# Patient Record
Sex: Male | Born: 1970
Health system: Southern US, Community
[De-identification: ages and names within clinical notes are randomized; demographics above are authoritative.]

## PROBLEM LIST (undated history)

## (undated) DIAGNOSIS — L309 Dermatitis, unspecified: Secondary | ICD-10-CM

## (undated) DIAGNOSIS — E119 Type 2 diabetes mellitus without complications: Secondary | ICD-10-CM

## (undated) DIAGNOSIS — I10 Essential (primary) hypertension: Secondary | ICD-10-CM

## (undated) HISTORY — DX: Dermatitis, unspecified: L30.9

## (undated) HISTORY — PX: GALLBLADDER SURGERY: SHX652

---

## 2017-08-20 DIAGNOSIS — J019 Acute sinusitis, unspecified: Secondary | ICD-10-CM | POA: Diagnosis not present

## 2017-09-15 DIAGNOSIS — H00022 Hordeolum internum right lower eyelid: Secondary | ICD-10-CM | POA: Diagnosis not present

## 2017-10-26 DIAGNOSIS — I10 Essential (primary) hypertension: Secondary | ICD-10-CM | POA: Diagnosis not present

## 2017-10-26 DIAGNOSIS — E1169 Type 2 diabetes mellitus with other specified complication: Secondary | ICD-10-CM | POA: Diagnosis not present

## 2017-10-26 DIAGNOSIS — E7800 Pure hypercholesterolemia, unspecified: Secondary | ICD-10-CM | POA: Diagnosis not present

## 2017-10-26 DIAGNOSIS — Z794 Long term (current) use of insulin: Secondary | ICD-10-CM | POA: Diagnosis not present

## 2018-02-28 DIAGNOSIS — I10 Essential (primary) hypertension: Secondary | ICD-10-CM | POA: Diagnosis not present

## 2018-02-28 DIAGNOSIS — E1169 Type 2 diabetes mellitus with other specified complication: Secondary | ICD-10-CM | POA: Diagnosis not present

## 2018-02-28 DIAGNOSIS — Z794 Long term (current) use of insulin: Secondary | ICD-10-CM | POA: Diagnosis not present

## 2018-02-28 DIAGNOSIS — Z23 Encounter for immunization: Secondary | ICD-10-CM | POA: Diagnosis not present

## 2018-02-28 DIAGNOSIS — E7800 Pure hypercholesterolemia, unspecified: Secondary | ICD-10-CM | POA: Diagnosis not present

## 2018-02-28 DIAGNOSIS — Z125 Encounter for screening for malignant neoplasm of prostate: Secondary | ICD-10-CM | POA: Diagnosis not present

## 2018-06-20 ENCOUNTER — Encounter (HOSPITAL_COMMUNITY): Payer: Self-pay

## 2018-06-20 ENCOUNTER — Other Ambulatory Visit: Payer: Self-pay

## 2018-06-20 ENCOUNTER — Ambulatory Visit (HOSPITAL_COMMUNITY)
Admission: EM | Admit: 2018-06-20 | Discharge: 2018-06-20 | Disposition: A | Discharge status: Home / Self Care | Payer: Federal, State, Local not specified - PPO | Attending: Physician Assistant | Admitting: Physician Assistant

## 2018-06-20 DIAGNOSIS — J0140 Acute pansinusitis, unspecified: Secondary | ICD-10-CM

## 2018-06-20 HISTORY — DX: Essential (primary) hypertension: I10

## 2018-06-20 HISTORY — DX: Type 2 diabetes mellitus without complications: E11.9

## 2018-06-20 MED ORDER — DOXYCYCLINE HYCLATE 100 MG PO CAPS: 100.0000 mg | ORAL_CAPSULE | Freq: Two times a day (BID) | ORAL | 0 refills | Status: DC

## 2018-06-20 MED ORDER — IPRATROPIUM BROMIDE 0.06 % NA SOLN: 2.0000 | Freq: Four times a day (QID) | NASAL | 0 refills | Status: DC

## 2018-06-20 NOTE — Discharge Instructions (Signed)
Start doxycycline to cover for bacterial sinus infection. Continue zyrtec. Start atrovent nasal spray for nasal congestion/drainage. You can use over the counter nasal saline rinse such as neti pot for nasal congestion. Keep hydrated, your urine should be clear to pale yellow in color. Tylenol/motrin for fever and pain. Monitor for any worsening of symptoms, chest pain, shortness of breath, wheezing, swelling of the throat, follow up for reevaluation.

## 2018-06-20 NOTE — ED Provider Notes (Signed)
[facility]    CSN: [id number] Arrival date & time: 06/20/18  4801     History   Chief Complaint Chief Complaint  Patient presents with  . Cough    HPI Kevin Richardson is a 48 y.o. male.   48 year old male comes in for 2-week history of URI symptoms.  Has had sore throat, rhinorrhea, nasal congestion, voice hoarseness.  States a couple of days ago, started having productive cough and chest congestion.  Denies chest pain, shortness of breath, wheezing.  Denies fever, chills, night sweats.  Has been using allergy medicine, cold medication without relief.  Never smoker.  Positive sick contact.  States DM improving, last A1c 7's     Past Medical History:  Diagnosis Date  . Diabetes mellitus without complication (HCC)   . Hypertension     There are no active problems to display for this patient.   Past Surgical History:  Procedure Laterality Date  . GALLBLADDER SURGERY         Home Medications    Prior to Admission medications   Medication Sig Start Date End Date Taking? Authorizing Provider  doxycycline (VIBRAMYCIN) 100 MG capsule Take 1 capsule (100 mg total) by mouth 2 (two) times daily. 06/20/18   Cathie Hoops, Dejai Schubach V, PA-C  ipratropium (ATROVENT) 0.06 % nasal spray Place 2 sprays into both nostrils 4 (four) times daily. 06/20/18   Belinda Fisher, PA-C    Family History History reviewed. No pertinent family history.  Social History Social History   Tobacco Use  . Smoking status: Never Smoker  . Smokeless tobacco: Never Used  Substance Use Topics  . Alcohol use: Never    Frequency: Never  . Drug use: Never     Allergies   Penicillins   Review of Systems Review of Systems  Reason unable to perform ROS: See HPI as above.     Physical Exam Triage Vital Signs ED Triage Vitals  Enc Vitals Group     BP 06/20/18 0911 (!) 135/95     Pulse Rate 06/20/18 0911 81     Resp 06/20/18 0911 18     Temp 06/20/18 0911 97.9 F (36.6 C)     Temp Source 06/20/18 0911  Oral     SpO2 06/20/18 0911 98 %     Weight 06/20/18 0922 265 lb (120.2 kg)     Height --      Head Circumference --      Peak Flow --      Pain Score 06/20/18 0922 0     Pain Loc --      Pain Edu? --      Excl. in GC? --    No data found.  Updated Vital Signs BP (!) 135/95 (BP Location: Left Arm)   Pulse 81   Temp 97.9 F (36.6 C) (Oral)   Resp 18   Wt 265 lb (120.2 kg)   SpO2 98%   Physical Exam Constitutional:      General: He is not in acute distress.    Appearance: He is well-developed. He is not ill-appearing, toxic-appearing or diaphoretic.  HENT:     Head: Normocephalic and atraumatic.     Right Ear: Tympanic membrane, ear canal and external ear normal. Tympanic membrane is not erythematous or bulging.     Left Ear: Tympanic membrane, ear canal and external ear normal. Tympanic membrane is not erythematous or bulging.     Nose:     Right Sinus: Maxillary sinus tenderness  and frontal sinus tenderness present.     Left Sinus: Maxillary sinus tenderness and frontal sinus tenderness present.     Mouth/Throat:     Mouth: Mucous membranes are moist.     Pharynx: Oropharynx is clear. Uvula midline.  Eyes:     Conjunctiva/sclera: Conjunctivae normal.     Pupils: Pupils are equal, round, and reactive to light.  Neck:     Musculoskeletal: Normal range of motion and neck supple.  Cardiovascular:     Rate and Rhythm: Normal rate and regular rhythm.     Heart sounds: Normal heart sounds. No murmur. No friction rub. No gallop.   Pulmonary:     Effort: Pulmonary effort is normal. No accessory muscle usage, prolonged expiration, respiratory distress or retractions.     Breath sounds: Normal breath sounds. No stridor, decreased air movement or transmitted upper airway sounds. No decreased breath sounds, wheezing, rhonchi or rales.  Skin:    General: Skin is warm and dry.  Neurological:     Mental Status: He is alert and oriented to person, place, and time.      UC  Treatments / Results  Labs (all labs ordered are listed, but only abnormal results are displayed) Labs Reviewed - No data to display  EKG None  Radiology No results found.  Procedures Procedures (including critical care time)  Medications Ordered in UC Medications - No data to display  Initial Impression / Assessment and Plan / UC Course  I have reviewed the triage vital signs and the nursing notes.  Pertinent labs & imaging results that were available during my care of the patient were reviewed by me and considered in my medical decision making (see chart for details).    Start doxycycline to cover for bacterial sinusitis. Other symptomatic treatment discussed, continue antihistamine. Push fluids. Return precautions given.  Final Clinical Impressions(s) / UC Diagnoses   Final diagnoses:  Acute non-recurrent pansinusitis    ED Prescriptions    Medication Sig Dispense Auth. Provider   doxycycline (VIBRAMYCIN) 100 MG capsule Take 1 capsule (100 mg total) by mouth 2 (two) times daily. 20 capsule Margaretann Abate V, PA-C   ipratropium (ATROVENT) 0.06 % nasal spray Place 2 sprays into both nostrils 4 (four) times daily. 15 mL Threasa Alpha, [place] 06/20/18 [id number]

## 2018-06-20 NOTE — ED Triage Notes (Signed)
Pt cc  chest congestion, sinus pressure and drainage. X 1 week or more.pt has used the OTC meds and nothing worked.

## 2018-09-09 DIAGNOSIS — E1169 Type 2 diabetes mellitus with other specified complication: Secondary | ICD-10-CM | POA: Diagnosis not present

## 2018-09-09 DIAGNOSIS — I10 Essential (primary) hypertension: Secondary | ICD-10-CM | POA: Diagnosis not present

## 2018-09-09 DIAGNOSIS — Z794 Long term (current) use of insulin: Secondary | ICD-10-CM | POA: Diagnosis not present

## 2018-09-09 DIAGNOSIS — E7800 Pure hypercholesterolemia, unspecified: Secondary | ICD-10-CM | POA: Diagnosis not present

## 2018-09-10 ENCOUNTER — Encounter (HOSPITAL_COMMUNITY): Payer: Self-pay

## 2018-09-10 ENCOUNTER — Emergency Department (HOSPITAL_COMMUNITY): Payer: Federal, State, Local not specified - PPO

## 2018-09-10 ENCOUNTER — Other Ambulatory Visit: Payer: Self-pay

## 2018-09-10 ENCOUNTER — Emergency Department (HOSPITAL_COMMUNITY)
Admission: EM | Admit: 2018-09-10 | Discharge: 2018-09-10 | Disposition: A | Discharge status: Home / Self Care | Payer: Federal, State, Local not specified - PPO | Attending: Emergency Medicine | Admitting: Emergency Medicine

## 2018-09-10 DIAGNOSIS — I10 Essential (primary) hypertension: Secondary | ICD-10-CM | POA: Diagnosis not present

## 2018-09-10 DIAGNOSIS — S61213A Laceration without foreign body of left middle finger without damage to nail, initial encounter: Secondary | ICD-10-CM | POA: Diagnosis not present

## 2018-09-10 DIAGNOSIS — E119 Type 2 diabetes mellitus without complications: Secondary | ICD-10-CM | POA: Insufficient documentation

## 2018-09-10 DIAGNOSIS — S92592A Other fracture of left lesser toe(s), initial encounter for closed fracture: Secondary | ICD-10-CM | POA: Insufficient documentation

## 2018-09-10 DIAGNOSIS — S66121A Laceration of flexor muscle, fascia and tendon of left index finger at wrist and hand level, initial encounter: Secondary | ICD-10-CM | POA: Diagnosis not present

## 2018-09-10 DIAGNOSIS — S61315A Laceration without foreign body of left ring finger with damage to nail, initial encounter: Secondary | ICD-10-CM

## 2018-09-10 DIAGNOSIS — S56129A Laceration of flexor muscle, fascia and tendon of unspecified finger at forearm level, initial encounter: Secondary | ICD-10-CM

## 2018-09-10 DIAGNOSIS — S61412A Laceration without foreign body of left hand, initial encounter: Secondary | ICD-10-CM | POA: Diagnosis not present

## 2018-09-10 DIAGNOSIS — S62631A Displaced fracture of distal phalanx of left index finger, initial encounter for closed fracture: Secondary | ICD-10-CM | POA: Diagnosis not present

## 2018-09-10 DIAGNOSIS — Y939 Activity, unspecified: Secondary | ICD-10-CM | POA: Insufficient documentation

## 2018-09-10 DIAGNOSIS — S62661B Nondisplaced fracture of distal phalanx of left index finger, initial encounter for open fracture: Secondary | ICD-10-CM | POA: Diagnosis not present

## 2018-09-10 DIAGNOSIS — Z1159 Encounter for screening for other viral diseases: Secondary | ICD-10-CM | POA: Diagnosis not present

## 2018-09-10 DIAGNOSIS — W293XXA Contact with powered garden and outdoor hand tools and machinery, initial encounter: Secondary | ICD-10-CM | POA: Diagnosis not present

## 2018-09-10 DIAGNOSIS — Z23 Encounter for immunization: Secondary | ICD-10-CM | POA: Diagnosis not present

## 2018-09-10 DIAGNOSIS — S62633B Displaced fracture of distal phalanx of left middle finger, initial encounter for open fracture: Secondary | ICD-10-CM | POA: Diagnosis not present

## 2018-09-10 DIAGNOSIS — S62633A Displaced fracture of distal phalanx of left middle finger, initial encounter for closed fracture: Secondary | ICD-10-CM | POA: Diagnosis not present

## 2018-09-10 DIAGNOSIS — S61209A Unspecified open wound of unspecified finger without damage to nail, initial encounter: Secondary | ICD-10-CM

## 2018-09-10 DIAGNOSIS — S62661A Nondisplaced fracture of distal phalanx of left index finger, initial encounter for closed fracture: Secondary | ICD-10-CM | POA: Diagnosis not present

## 2018-09-10 DIAGNOSIS — S61313A Laceration without foreign body of left middle finger with damage to nail, initial encounter: Secondary | ICD-10-CM

## 2018-09-10 DIAGNOSIS — S62631B Displaced fracture of distal phalanx of left index finger, initial encounter for open fracture: Secondary | ICD-10-CM | POA: Diagnosis not present

## 2018-09-10 DIAGNOSIS — S62639B Displaced fracture of distal phalanx of unspecified finger, initial encounter for open fracture: Secondary | ICD-10-CM

## 2018-09-10 DIAGNOSIS — Y929 Unspecified place or not applicable: Secondary | ICD-10-CM | POA: Diagnosis not present

## 2018-09-10 DIAGNOSIS — Z03818 Encounter for observation for suspected exposure to other biological agents ruled out: Secondary | ICD-10-CM | POA: Diagnosis not present

## 2018-09-10 LAB — CBC WITH DIFFERENTIAL/PLATELET
Abs Immature Granulocytes: 0.01 10*3/uL (ref 0.00–0.07)
Basophils Absolute: 0 10*3/uL (ref 0.0–0.1)
Basophils Relative: 0 %
Eosinophils Absolute: 0 10*3/uL (ref 0.0–0.5)
Eosinophils Relative: 1 %
HCT: 42.3 % (ref 39.0–52.0)
Hemoglobin: 13.9 g/dL (ref 13.0–17.0)
Immature Granulocytes: 0 %
Lymphocytes Relative: 25 %
Lymphs Abs: 1.3 10*3/uL (ref 0.7–4.0)
MCH: 27.4 pg (ref 26.0–34.0)
MCHC: 32.9 g/dL (ref 30.0–36.0)
MCV: 83.3 fL (ref 80.0–100.0)
Monocytes Absolute: 0.3 10*3/uL (ref 0.1–1.0)
Monocytes Relative: 5 %
Neutro Abs: 3.6 10*3/uL (ref 1.7–7.7)
Neutrophils Relative %: 69 %
Platelets: 288 10*3/uL (ref 150–400)
RBC: 5.08 MIL/uL (ref 4.22–5.81)
RDW: 12.7 % (ref 11.5–15.5)
WBC: 5.2 10*3/uL (ref 4.0–10.5)
nRBC: 0 % (ref 0.0–0.2)

## 2018-09-10 LAB — BASIC METABOLIC PANEL
Anion gap: 8 (ref 5–15)
BUN: 18 mg/dL (ref 6–20)
CO2: 27 mmol/L (ref 22–32)
Calcium: 9.3 mg/dL (ref 8.9–10.3)
Chloride: 99 mmol/L (ref 98–111)
Creatinine, Ser: 1.04 mg/dL (ref 0.61–1.24)
GFR calc Af Amer: >60 mL/min (ref >60)
GFR calc non Af Amer: >60 mL/min (ref >60)
Glucose, Bld: 127 mg/dL — ABNORMAL HIGH (ref 70–99)
Potassium: 3.3 mmol/L — ABNORMAL LOW (ref 3.5–5.1)
Sodium: 134 mmol/L — ABNORMAL LOW (ref 135–145)

## 2018-09-10 LAB — SARS CORONAVIRUS 2 BY RT PCR (HOSPITAL ORDER, PERFORMED IN ~~LOC~~ HOSPITAL LAB): SARS Coronavirus 2: NEGATIVE

## 2018-09-10 LAB — CBG MONITORING, ED: Glucose-Capillary: 118 mg/dL — ABNORMAL HIGH (ref 70–99)

## 2018-09-10 MED ORDER — TETANUS-DIPHTH-ACELL PERTUSSIS 5-2.5-18.5 LF-MCG/0.5 IM SUSP
0.5000 mL | Freq: Once | INTRAMUSCULAR | Status: AC
  Administered 2018-09-10: 0.5 mL via INTRAMUSCULAR
  Filled 2018-09-10: qty 0.5

## 2018-09-10 MED ORDER — SULFAMETHOXAZOLE-TRIMETHOPRIM 800-160 MG PO TABS: 1.0000 | ORAL_TABLET | Freq: Two times a day (BID) | ORAL | 0 refills | Status: AC

## 2018-09-10 MED ORDER — HYDROCODONE-ACETAMINOPHEN 5-325 MG PO TABS: 2.0000 | ORAL_TABLET | ORAL | 0 refills | Status: AC | PRN

## 2018-09-10 MED ORDER — LIDOCAINE HCL (PF) 1 % IJ SOLN
30.0000 mL | Freq: Once | INTRAMUSCULAR | Status: AC
  Administered 2018-09-10: 30 mL
  Filled 2018-09-10: qty 30

## 2018-09-10 NOTE — ED Triage Notes (Signed)
Onset 4:15p pt cut left middle and index finger with hedge trimmer.  Oozing blood.  Moving fingers, sensation intact.

## 2018-09-10 NOTE — ED Notes (Signed)
Pt feeling lightheaded, wanting BS checked.

## 2018-09-10 NOTE — Discharge Instructions (Signed)
You are scheduled for surgery with Dr. Amanda Pea tomorrow.  Please do not eat or drink anything after midnight.  The medication prescribed for pain may cause dizziness, drowsiness or addiction.  Please only take as needed for severe pain.  Please take your antibiotics exactly as prescribed and until completed. Thank you for allowing me to care for you today. Please return to the emergency department if you have new or worsening symptoms. Take your medications as instructed.

## 2018-09-10 NOTE — Consult Note (Signed)
Reason for Consult: Hedge trimmer injury left index and middle finger Referring Physician: ER staff  Dell Pontodward Smaldone is an 48 y.o. male.  HPI: Patient presents with left index and middle finger hedge trimmer injury.  Patient is a diabetic.  He takes a insulin.  Patient notes no other complaints today.  I reviewed all issues with him at length.  Following our discussion we performed a evaluation of his x-rays.  There is bony encroachment on his x-ray.  At this juncture I do feel he needs a irrigation debridement and repair of structures with evaluation as necessary.  Although the patient states he is allergic to penicillin he endorses that he can take amoxicillin and Augmentin without difficulty.  Past Medical History:  Diagnosis Date  . Diabetes mellitus without complication (HCC)   . Hypertension     Past Surgical History:  Procedure Laterality Date  . GALLBLADDER SURGERY      History reviewed. No pertinent family history.  Social History:  reports that he has never smoked. He has never used smokeless tobacco. He reports that he does not drink alcohol or use drugs.  Allergies:  Allergies  Allergen Reactions  . Penicillins Anaphylaxis    Medications: I have reviewed the patient's current medications.  Results for orders placed or performed during the hospital encounter of 09/10/18 (from the past 48 hour(s))  POC CBG, ED     Status: Abnormal   Collection Time: 09/10/18  5:24 PM  Result Value Ref Range   Glucose-Capillary 118 (H) 70 - 99 mg/dL    Dg Hand Complete Left  Result Date: 09/10/2018 CLINICAL DATA:  Trauma, injury, lacerations left second third digits EXAM: LEFT HAND - COMPLETE 3+ VIEW COMPARISON:  None. FINDINGS: Left second digit distal phalanx tuft acute nondisplaced fracture noted. Left third finger distal phalanx small avulsion fracture at the DIP joint noted. Soft tissue injuries overlying these fractures. No subluxation dislocation. No other joint  abnormality. No radiopaque foreign body. IMPRESSION: Acute fractures of the left second and third distal phalanges as above. Electronically Signed   By: Judie PetitM.  Shick M.D.   On: 09/10/2018 17:46    Review of Systems  Respiratory: Negative.   Cardiovascular: Negative.   Gastrointestinal: Negative.   Genitourinary: Negative.    Blood pressure 117/86, pulse 88, temperature 99.2 F (37.3 C), temperature source Oral, resp. rate 16, SpO2 100 %. Physical Exam Open injury to the index and middle finger secondary to hedge trimmer injury left hand.  He has bony injury notable.  The second and third distal phalanges have significant bony injury to them.  The patient is alert and oriented in no acute distress. The patient complains of pain in the affected upper extremity.  The patient is noted to have a normal HEENT exam. Lung fields show equal chest expansion and no shortness of breath. Abdomen exam is nontender without distention. Lower extremity examination does not show any fracture dislocation or blood clot symptoms. Pelvis is stable and the neck and back are stable and nontender. Assessment/Plan: Patient has significant injury to the index and middle finger secondary to hedge trimming injury process.  We will plan for irrigation debridement and exploration.  Procedure: Patient was seen and underwent a digital block by myself with lidocaine without epinephrine he was prepped and draped with Betadine scrub and paint x2 separate scrubs followed by irrigation.  Once this was complete we attended to the index finger with nail plate removal followed by debridement of skin subtenons tissue and bone  and open treatment of his distal tuft injury/fracture followed by repair of the nailbed and the skin laceration.  Thus the index finger was addressed with #1 irrigation and debridement of an open bony injury/fracture #2 nail plate removal #3 complex nailbed repair #4 open treatment distal fracture about the  phalanx  Following this the middle finger was addressed.  I performed irrigation debridement of skin subtenons tissue tendon and bone.  He had an open fracture.  He has a significant FDP laceration and also has a radial digital nerve laceration.  He has multiple cuts.  Given the tendon injury open DIP joint open bony fracture and the nerve injury as well as ligamentous encroachment radially I irrigated debrided and performed a thorough exploration of the wound.  We elected to set him up for an elective repair after the severity was noted.  I dressed the wound according to a sterile protocol and discussed with the patient the findings.  Thus in regards to the middle finger the patient #1 underwent irrigation debridement of an open fracture and #2 exploration at great length noting the significant nerve tendon injury as well as collateral ligament and DIP encroachment.  Following this we discussed with the patient all issues plans and concerns.  We will get him booked for surgery tomorrow morning for tendon nerve and associated structure repair.  All questions have been addressed.  We are planning surgery for your upper extremity. The risk and benefits of surgery to include risk of bleeding, infection, anesthesia,  damage to normal structures and failure of the surgery to accomplish its intended goals of relieving symptoms and restoring function have been discussed in detail. With this in mind we plan to proceed. I have specifically discussed with the patient the pre-and postoperative regime and the dos and don'ts and risk and benefits in great detail. Risk and benefits of surgery also include risk of dystrophy(CRPS), chronic nerve pain, failure of the healing process to go onto completion and other inherent risks of surgery The relavent the pathophysiology of the disease/injury process, as well as the alternatives for treatment and postoperative course of action has been discussed in great detail with  the patient who desires to proceed.  We will do everything in our power to help you (the patient) restore function to the upper extremity. It is a pleasure to see this patient today.   Dionne Ano Jorja Empie III 09/10/2018, 7:01 PM

## 2018-09-10 NOTE — ED Provider Notes (Signed)
[facility] Note   CSN: [id number] Arrival date & time: 09/10/18  1708    History   Chief Complaint Chief Complaint  Patient presents with  . Laceration    HPI Dell Pontodward Viets is a 48 y.o. male.     Patient is a 48 year old gentleman with past medical history of diabetes, hypertension presenting to the emergency department for left hand laceration after attempting to use his hedge cutter today.  This happened just prior to arrival.  Unknown tetanus shot.     Past Medical History:  Diagnosis Date  . Diabetes mellitus without complication (HCC)   . Hypertension     There are no active problems to display for this patient.   Past Surgical History:  Procedure Laterality Date  . GALLBLADDER SURGERY          Home Medications    Prior to Admission medications   Medication Sig Start Date End Date Taking? Authorizing Provider  doxycycline (VIBRAMYCIN) 100 MG capsule Take 1 capsule (100 mg total) by mouth 2 (two) times daily. 06/20/18   Cathie HoopsYu, Amy V, PA-C  HYDROcodone-acetaminophen (NORCO/VICODIN) 5-325 MG tablet Take 2 tablets by mouth every 4 (four) hours as needed for up to 5 days for severe pain. 09/10/18 09/15/18  Ronnie DossMcLean, Flannery Cavallero A, PA-C  ipratropium (ATROVENT) 0.06 % nasal spray Place 2 sprays into both nostrils 4 (four) times daily. 06/20/18   Cathie HoopsYu, Amy V, PA-C  sulfamethoxazole-trimethoprim (BACTRIM DS) 800-160 MG tablet Take 1 tablet by mouth 2 (two) times daily for 14 days. 09/10/18 09/24/18  Arlyn DunningMcLean, Vannia Pola A, PA-C    Family History History reviewed. No pertinent family history.  Social History Social History   Tobacco Use  . Smoking status: Never Smoker  . Smokeless tobacco: Never Used  Substance Use Topics  . Alcohol use: Never    Frequency: Never  . Drug use: Never     Allergies   Penicillins   Review of Systems Review of Systems  Constitutional: Negative for fever.  Musculoskeletal: Positive for arthralgias.   Skin: Positive for wound.  Allergic/Immunologic: Negative for immunocompromised state.  Neurological: Negative for dizziness and headaches.  Hematological: Does not bruise/bleed easily.     Physical Exam Updated Vital Signs BP 117/86   Pulse 88   Temp 99.2 F (37.3 C) (Oral)   Resp 16   SpO2 100%   Physical Exam Vitals signs and nursing note reviewed.  Constitutional:      Appearance: Normal appearance.  HENT:     Head: Normocephalic.  Eyes:     Conjunctiva/sclera: Conjunctivae normal.  Pulmonary:     Effort: Pulmonary effort is normal.  Skin:    General: Skin is dry.     Comments: Left hand index finger and middle finger with distal multiple lacerations, bleeding.  There is damage to both nails.  Neurological:     Mental Status: He is alert.  Psychiatric:        Mood and Affect: Mood normal.      ED Treatments / Results  Labs (all labs ordered are listed, but only abnormal results are displayed) Labs Reviewed  CBG MONITORING, ED - Abnormal; Notable for the following components:      Result Value   Glucose-Capillary 118 (*)    All other components within normal limits  SARS CORONAVIRUS 2 (HOSPITAL ORDER, PERFORMED IN [place] [facility])  BASIC METABOLIC PANEL  CBC WITH DIFFERENTIAL/PLATELET    EKG None  Radiology Dg Hand Complete  Left  Result Date: 09/10/2018 CLINICAL DATA:  Trauma, injury, lacerations left second third digits EXAM: LEFT HAND - COMPLETE 3+ VIEW COMPARISON:  None. FINDINGS: Left second digit distal phalanx tuft acute nondisplaced fracture noted. Left third finger distal phalanx small avulsion fracture at the DIP joint noted. Soft tissue injuries overlying these fractures. No subluxation dislocation. No other joint abnormality. No radiopaque foreign body. IMPRESSION: Acute fractures of the left second and third distal phalanges as above. Electronically Signed   By: Judie Petit.  Shick M.D.   On: 09/10/2018 17:46    Procedures Procedures  (including critical care time)  Medications Ordered in ED Medications  Tdap (BOOSTRIX) injection 0.5 mL (0.5 mLs Intramuscular Given 09/10/18 1746)  lidocaine (PF) (XYLOCAINE) 1 % injection 30 mL (30 mLs Infiltration Given 09/10/18 1747)     Initial Impression / Assessment and Plan / ED Course  I have reviewed the triage vital signs and the nursing notes.  Pertinent labs & imaging results that were available during my care of the patient were reviewed by me and considered in my medical decision making (see chart for details).  Clinical Course as of Sep 09 1925  Sat Sep 10, 2018  1728 I discussed the case with Dr. Amanda Pea the hand Ortho specialist who will come and assess the patient.  At this time will obtain a hand x-ray, tetanus shot and will place materials at bedside for the surgeon.   [KM]  1906 I assisted Dr. Amanda Pea with the repair of this laceration.  Upon washing out the finger it was realized that there is a tendon laceration and nerve laceration.  Dr. Amanda Pea thought it best to bring the patient to the OR.  Will schedule for OR tomorrow morning.  Per Dr. Carlos Levering orders I ordered a COVID test, basic labs, EKG.  Per Dr. Carlos Levering orders I prescribed the patient 14 days of Bactrim and 30 tablets of hydrocodone.   [KM]    Clinical Course User Index [KM] Arlyn Dunning, PA-C         Final Clinical Impressions(s) / ED Diagnoses   Final diagnoses:  Open displaced fracture of distal phalanx of finger, unspecified finger, initial encounter  Laceration of left middle finger without foreign body with damage to nail, initial encounter  Laceration of left ring finger without foreign body with damage to nail, initial encounter  Flexor tendon laceration of finger with open wound, initial encounter    ED Discharge Orders         Ordered    sulfamethoxazole-trimethoprim (BACTRIM DS) 800-160 MG tablet  2 times daily     09/10/18 1852    HYDROcodone-acetaminophen (NORCO/VICODIN) 5-325  MG tablet  Every 4 hours PRN     09/10/18 1852           Jeral Pinch 09/10/18 Dara Lords, MD 09/13/18 1512

## 2018-09-11 ENCOUNTER — Ambulatory Visit (HOSPITAL_COMMUNITY)
Admission: RE | Admit: 2018-09-11 | Discharge: 2018-09-11 | Disposition: A | Discharge status: Home / Self Care | Payer: Federal, State, Local not specified - PPO | Attending: Orthopedic Surgery | Admitting: Orthopedic Surgery

## 2018-09-11 ENCOUNTER — Ambulatory Visit (HOSPITAL_COMMUNITY): Payer: Federal, State, Local not specified - PPO | Admitting: Registered Nurse

## 2018-09-11 ENCOUNTER — Encounter (HOSPITAL_COMMUNITY)
Admission: RE | Disposition: A | Discharge status: Home / Self Care | Payer: Self-pay | Source: Home / Self Care | Attending: Orthopedic Surgery

## 2018-09-11 DIAGNOSIS — Z794 Long term (current) use of insulin: Secondary | ICD-10-CM | POA: Insufficient documentation

## 2018-09-11 DIAGNOSIS — Z79899 Other long term (current) drug therapy: Secondary | ICD-10-CM | POA: Insufficient documentation

## 2018-09-11 DIAGNOSIS — S62603B Fracture of unspecified phalanx of left middle finger, initial encounter for open fracture: Secondary | ICD-10-CM | POA: Diagnosis not present

## 2018-09-11 DIAGNOSIS — S62633B Displaced fracture of distal phalanx of left middle finger, initial encounter for open fracture: Secondary | ICD-10-CM | POA: Diagnosis not present

## 2018-09-11 DIAGNOSIS — W293XXA Contact with powered garden and outdoor hand tools and machinery, initial encounter: Secondary | ICD-10-CM | POA: Diagnosis not present

## 2018-09-11 DIAGNOSIS — S64493A Injury of digital nerve of left middle finger, initial encounter: Secondary | ICD-10-CM | POA: Diagnosis not present

## 2018-09-11 DIAGNOSIS — E119 Type 2 diabetes mellitus without complications: Secondary | ICD-10-CM | POA: Diagnosis not present

## 2018-09-11 DIAGNOSIS — S63633A Sprain of interphalangeal joint of left middle finger, initial encounter: Secondary | ICD-10-CM | POA: Insufficient documentation

## 2018-09-11 DIAGNOSIS — I10 Essential (primary) hypertension: Secondary | ICD-10-CM | POA: Insufficient documentation

## 2018-09-11 DIAGNOSIS — S66123A Laceration of flexor muscle, fascia and tendon of left middle finger at wrist and hand level, initial encounter: Secondary | ICD-10-CM | POA: Insufficient documentation

## 2018-09-11 DIAGNOSIS — S62601B Fracture of unspecified phalanx of left index finger, initial encounter for open fracture: Secondary | ICD-10-CM | POA: Diagnosis not present

## 2018-09-11 HISTORY — PX: ARTERY AND TENDON REPAIR: SHX5696

## 2018-09-11 HISTORY — PX: I & D EXTREMITY: SHX5045

## 2018-09-11 LAB — GLUCOSE, CAPILLARY
Glucose-Capillary: 147 mg/dL — ABNORMAL HIGH (ref 70–99)
Glucose-Capillary: 121 mg/dL — ABNORMAL HIGH (ref 70–99)

## 2018-09-11 SURGERY — IRRIGATION AND DEBRIDEMENT EXTREMITY
Anesthesia: General | Site: Finger | Laterality: Left

## 2018-09-11 MED ORDER — KETOROLAC TROMETHAMINE 30 MG/ML IJ SOLN: 30.0000 mg | Freq: Once | INTRAMUSCULAR | Status: DC

## 2018-09-11 MED ORDER — ONDANSETRON HCL 4 MG/2ML IJ SOLN
INTRAMUSCULAR | Status: DC | PRN
  Administered 2018-09-11: 4 mg via INTRAVENOUS

## 2018-09-11 MED ORDER — FENTANYL CITRATE (PF) 250 MCG/5ML IJ SOLN
INTRAMUSCULAR | Status: AC
  Filled 2018-09-11: qty 5

## 2018-09-11 MED ORDER — FENTANYL CITRATE (PF) 100 MCG/2ML IJ SOLN
INTRAMUSCULAR | Status: DC | PRN
  Administered 2018-09-11 (×2): 50 ug via INTRAVENOUS

## 2018-09-11 MED ORDER — PROPOFOL 10 MG/ML IV BOLUS
INTRAVENOUS | Status: AC
  Filled 2018-09-11: qty 20

## 2018-09-11 MED ORDER — MIDAZOLAM HCL 2 MG/2ML IJ SOLN
INTRAMUSCULAR | Status: AC
  Filled 2018-09-11: qty 2

## 2018-09-11 MED ORDER — BUPIVACAINE HCL (PF) 0.25 % IJ SOLN
INTRAMUSCULAR | Status: DC | PRN
  Administered 2018-09-11: 10 mL

## 2018-09-11 MED ORDER — ACETAMINOPHEN 500 MG PO TABS
ORAL_TABLET | ORAL | Status: AC
  Filled 2018-09-11: qty 2

## 2018-09-11 MED ORDER — LIDOCAINE 2% (20 MG/ML) 5 ML SYRINGE
INTRAMUSCULAR | Status: DC | PRN
  Administered 2018-09-11: 60 mg via INTRAVENOUS

## 2018-09-11 MED ORDER — PROPOFOL 10 MG/ML IV BOLUS
INTRAVENOUS | Status: DC | PRN
  Administered 2018-09-11: 200 mg via INTRAVENOUS

## 2018-09-11 MED ORDER — ONDANSETRON HCL 4 MG/2ML IJ SOLN
INTRAMUSCULAR | Status: AC
  Filled 2018-09-11: qty 2

## 2018-09-11 MED ORDER — CEFAZOLIN SODIUM-DEXTROSE 2-4 GM/100ML-% IV SOLN
INTRAVENOUS | Status: AC
  Filled 2018-09-11: qty 100

## 2018-09-11 MED ORDER — LIDOCAINE 2% (20 MG/ML) 5 ML SYRINGE
INTRAMUSCULAR | Status: AC
  Filled 2018-09-11: qty 5

## 2018-09-11 MED ORDER — PROMETHAZINE HCL 25 MG/ML IJ SOLN: 6.2500 mg | INTRAMUSCULAR | Status: DC | PRN

## 2018-09-11 MED ORDER — CEFAZOLIN SODIUM-DEXTROSE 2-3 GM-%(50ML) IV SOLR
INTRAVENOUS | Status: DC | PRN
  Administered 2018-09-11: 2 g via INTRAVENOUS

## 2018-09-11 MED ORDER — DEXAMETHASONE SODIUM PHOSPHATE 10 MG/ML IJ SOLN
INTRAMUSCULAR | Status: AC
  Filled 2018-09-11: qty 1

## 2018-09-11 MED ORDER — MIDAZOLAM HCL 2 MG/2ML IJ SOLN
INTRAMUSCULAR | Status: DC | PRN
  Administered 2018-09-11: 2 mg via INTRAVENOUS

## 2018-09-11 MED ORDER — ACETAMINOPHEN 10 MG/ML IV SOLN: 1000.0000 mg | Freq: Once | INTRAVENOUS | Status: DC | PRN

## 2018-09-11 MED ORDER — LACTATED RINGERS IV SOLN
INTRAVENOUS | Status: DC | PRN
  Administered 2018-09-11: 07:00:00 via INTRAVENOUS

## 2018-09-11 MED ORDER — OXYCODONE HCL 5 MG/5ML PO SOLN: 5.0000 mg | Freq: Once | ORAL | Status: DC | PRN

## 2018-09-11 MED ORDER — SODIUM CHLORIDE 0.9 % IR SOLN
Status: DC | PRN
  Administered 2018-09-11: 3000 mL
  Administered 2018-09-11: 1

## 2018-09-11 MED ORDER — HYDROMORPHONE HCL 1 MG/ML IJ SOLN: 0.2500 mg | INTRAMUSCULAR | Status: DC | PRN

## 2018-09-11 MED ORDER — ACETAMINOPHEN 500 MG PO TABS
1000.0000 mg | ORAL_TABLET | Freq: Once | ORAL | Status: AC
  Administered 2018-09-11: 1000 mg via ORAL

## 2018-09-11 MED ORDER — OXYCODONE HCL 5 MG PO TABS: 5.0000 mg | ORAL_TABLET | Freq: Once | ORAL | Status: DC | PRN

## 2018-09-11 MED ORDER — DEXAMETHASONE SODIUM PHOSPHATE 10 MG/ML IJ SOLN
INTRAMUSCULAR | Status: DC | PRN
  Administered 2018-09-11: 5 mg via INTRAVENOUS

## 2018-09-11 SURGICAL SUPPLY — 60 items
BANDAGE ACE 3X5.8 VEL STRL LF (GAUZE/BANDAGES/DRESSINGS) ×2 IMPLANT
BANDAGE ACE 4X5 VEL STRL LF (GAUZE/BANDAGES/DRESSINGS) ×6 IMPLANT
BNDG COHESIVE 1X5 TAN STRL LF (GAUZE/BANDAGES/DRESSINGS) IMPLANT
BNDG CONFORM 2 STRL LF (GAUZE/BANDAGES/DRESSINGS) IMPLANT
BNDG GAUZE ELAST 4 BULKY (GAUZE/BANDAGES/DRESSINGS) ×4 IMPLANT
CORDS BIPOLAR (ELECTRODE) ×2 IMPLANT
COVER SURGICAL LIGHT HANDLE (MISCELLANEOUS) ×2 IMPLANT
COVER WAND RF STERILE (DRAPES) ×2 IMPLANT
CUFF TOURNIQUET SINGLE 18IN (TOURNIQUET CUFF) ×2 IMPLANT
CUFF TOURNIQUET SINGLE 24IN (TOURNIQUET CUFF) IMPLANT
DECANTER SPIKE VIAL GLASS SM (MISCELLANEOUS) ×2 IMPLANT
DRAPE SURG 17X23 STRL (DRAPES) ×2 IMPLANT
DRSG ADAPTIC 3X8 NADH LF (GAUZE/BANDAGES/DRESSINGS) ×2 IMPLANT
DRSG MEPITEL 4X7.2 (GAUZE/BANDAGES/DRESSINGS) ×2 IMPLANT
DRSG XEROFORM 1X8 (GAUZE/BANDAGES/DRESSINGS) ×2 IMPLANT
GAUZE SPONGE 2X2 8PLY STRL LF (GAUZE/BANDAGES/DRESSINGS) IMPLANT
GAUZE SPONGE 4X4 12PLY STRL (GAUZE/BANDAGES/DRESSINGS) ×2 IMPLANT
GAUZE XEROFORM 1X8 LF (GAUZE/BANDAGES/DRESSINGS) ×2 IMPLANT
GLOVE BIOGEL M 8.0 STRL (GLOVE) ×2 IMPLANT
GLOVE SS BIOGEL STRL SZ 8 (GLOVE) ×1 IMPLANT
GLOVE SUPERSENSE BIOGEL SZ 8 (GLOVE) ×1
GOWN STRL REUS W/ TWL LRG LVL3 (GOWN DISPOSABLE) ×2 IMPLANT
GOWN STRL REUS W/ TWL XL LVL3 (GOWN DISPOSABLE) ×3 IMPLANT
GOWN STRL REUS W/TWL LRG LVL3 (GOWN DISPOSABLE) ×2
GOWN STRL REUS W/TWL XL LVL3 (GOWN DISPOSABLE) ×3
KIT BASIN OR (CUSTOM PROCEDURE TRAY) ×2 IMPLANT
KIT TURNOVER KIT B (KITS) ×2 IMPLANT
LOOP VESSEL MAXI BLUE (MISCELLANEOUS) IMPLANT
MANIFOLD NEPTUNE II (INSTRUMENTS) ×2 IMPLANT
NEEDLE HYPO 25GX1X1/2 BEV (NEEDLE) ×2 IMPLANT
NS IRRIG 1000ML POUR BTL (IV SOLUTION) ×2 IMPLANT
PACK ORTHO EXTREMITY (CUSTOM PROCEDURE TRAY) ×2 IMPLANT
PAD ARMBOARD 7.5X6 YLW CONV (MISCELLANEOUS) ×4 IMPLANT
PAD CAST 4YDX4 CTTN HI CHSV (CAST SUPPLIES) ×3 IMPLANT
PADDING CAST COTTON 4X4 STRL (CAST SUPPLIES) ×3
SCRUB BETADINE 4OZ XXX (MISCELLANEOUS) ×4 IMPLANT
SET CYSTO W/LG BORE CLAMP LF (SET/KITS/TRAYS/PACK) ×2 IMPLANT
SOL PREP POV-IOD 4OZ 10% (MISCELLANEOUS) ×4 IMPLANT
SPEAR EYE SURG WECK-CEL (MISCELLANEOUS) ×2 IMPLANT
SPECIMEN JAR SMALL (MISCELLANEOUS) ×2 IMPLANT
SPLINT FIBERGLASS 3X12 (CAST SUPPLIES) ×2 IMPLANT
SPONGE GAUZE 2X2 STER 10/PKG (GAUZE/BANDAGES/DRESSINGS)
SPONGE LAP 4X18 RFD (DISPOSABLE) ×2 IMPLANT
SUT ETHILON 8 0 BV130 4 (SUTURE) ×2 IMPLANT
SUT FIBER WIRE 4.0 (SUTURE) ×2 IMPLANT
SUT MERSILENE 4 0 P 3 (SUTURE) IMPLANT
SUT PROLENE 4 0 PS 2 18 (SUTURE) ×2 IMPLANT
SUT PROLENE 5 0 P 3 (SUTURE) ×2 IMPLANT
SUT VIC AB 2-0 CT1 27 (SUTURE)
SUT VIC AB 2-0 CT1 TAPERPNT 27 (SUTURE) IMPLANT
SUT VIC AB 4-0 P-3 18X BRD (SUTURE) ×1 IMPLANT
SUT VIC AB 4-0 P3 18 (SUTURE) ×1
SWAB CULTURE ESWAB REG 1ML (MISCELLANEOUS) IMPLANT
SYR CONTROL 10ML LL (SYRINGE) ×2 IMPLANT
TOWEL OR 17X24 6PK STRL BLUE (TOWEL DISPOSABLE) ×2 IMPLANT
TOWEL OR 17X26 10 PK STRL BLUE (TOWEL DISPOSABLE) ×2 IMPLANT
TUBE CONNECTING 12X1/4 (SUCTIONS) ×2 IMPLANT
UNDERPAD 30X30 (UNDERPADS AND DIAPERS) ×2 IMPLANT
WATER STERILE IRR 1000ML POUR (IV SOLUTION) ×2 IMPLANT
YANKAUER SUCT BULB TIP NO VENT (SUCTIONS) ×2 IMPLANT

## 2018-09-11 NOTE — Anesthesia Postprocedure Evaluation (Signed)
Anesthesia Post Note  Patient: Willy Charrier  Procedure(s) Performed: IRRIGATION AND DEBRIDEMENT LEFT HAND (Left Finger) ARTERY, TENDON, NERVE REPAIR AS NECESSARY (Left Finger)     Patient location during evaluation: PACU Anesthesia Type: General Level of consciousness: awake and alert Pain management: pain level controlled Vital Signs Assessment: post-procedure vital signs reviewed and stable Respiratory status: spontaneous breathing, nonlabored ventilation, respiratory function stable and patient connected to nasal cannula oxygen Cardiovascular status: blood pressure returned to baseline and stable Postop Assessment: no apparent nausea or vomiting Anesthetic complications: no    Last Vitals:  Vitals:   09/11/18 1055 09/11/18 1110  BP: 130/86 132/87  Pulse: 71 70  Resp: 13 15  Temp:    SpO2: 100% 98%    Last Pain:  Vitals:   09/11/18 1110  PainSc: 0-No pain                 Ryan P Ellender

## 2018-09-11 NOTE — Discharge Instructions (Signed)
Keep bandage clean and dry.  Call for any problems.  No smoking.  Criteria for driving a car: you should be off your pain medicine for 7-8 hours, able to drive one handed(confident), thinking clearly and feeling able in your judgement to drive. Continue elevation as it will decrease swelling. Call immediately for any sudden loss of feeling in your hand/arm or change in functional abilities of the extremity.We recommend that you to take vitamin C 1000 mg a day to promote healing. We also recommend that if you require  pain medicine that you take a stool softener to prevent constipation as most pain medicines will have constipation side effects. We recommend either Peri-Colace or Senokot and recommend that you also consider adding MiraLAX as well to prevent the constipation affects from pain medicine if you are required to use them. These medicines are over the counter and may be purchased at a local pharmacy. A cup of yogurt and a probiotic can also be helpful during the recovery process as the medicines can disrupt your intestinal environment.

## 2018-09-11 NOTE — Anesthesia Procedure Notes (Signed)
Procedure Name: LMA Insertion Date/Time: 09/11/2018 9:00 AM Performed by: Laruth Bouchard., CRNA Pre-anesthesia Checklist: Patient identified, Emergency Drugs available, Suction available, Patient being monitored and Timeout performed Patient Re-evaluated:Patient Re-evaluated prior to induction Oxygen Delivery Method: Circle system utilized Preoxygenation: Pre-oxygenation with 100% oxygen Induction Type: IV induction LMA: LMA inserted LMA Size: 4.0 Number of attempts: 1 Placement Confirmation: positive ETCO2 and breath sounds checked- equal and bilateral Tube secured with: Tape Dental Injury: Teeth and Oropharynx as per pre-operative assessment

## 2018-09-11 NOTE — Anesthesia Preprocedure Evaluation (Addendum)
Anesthesia Evaluation  Patient identified by MRN, date of birth, ID band Patient awake    Reviewed: Allergy & Precautions, NPO status , Patient's Chart, lab work & pertinent test results  Airway Mallampati: II  TM Distance: >3 FB Neck ROM: Full    Dental no notable dental hx.    Pulmonary neg pulmonary ROS,    Pulmonary exam normal breath sounds clear to auscultation       Cardiovascular hypertension, Pt. on medications Normal cardiovascular exam Rhythm:Regular Rate:Normal  ECG: NSR, rate 73   Neuro/Psych negative neurological ROS  negative psych ROS   GI/Hepatic negative GI ROS, Neg liver ROS,   Endo/Other  diabetes, Insulin Dependent  Renal/GU negative Renal ROS     Musculoskeletal negative musculoskeletal ROS (+)   Abdominal   Peds  Hematology negative hematology ROS (+)   Anesthesia Other Findings Injury to left hand  Reproductive/Obstetrics                           Anesthesia Physical Anesthesia Plan  ASA: III  Anesthesia Plan: General   Post-op Pain Management:    Induction: Intravenous  PONV Risk Score and Plan: 2 and Ondansetron, Dexamethasone, Midazolam and Treatment may vary due to age or medical condition  Airway Management Planned: LMA  Additional Equipment:   Intra-op Plan:   Post-operative Plan: Extubation in OR  Informed Consent: I have reviewed the patients History and Physical, chart, labs and discussed the procedure including the risks, benefits and alternatives for the proposed anesthesia with the patient or authorized representative who has indicated his/her understanding and acceptance.     Dental advisory given  Plan Discussed with: CRNA  Anesthesia Plan Comments:       Anesthesia Quick Evaluation

## 2018-09-11 NOTE — H&P (Signed)
Patient presents for surgical care.  Please see full consultation note last night.  We are planning surgery for your upper extremity. The risk and benefits of surgery to include risk of bleeding, infection, anesthesia,  damage to normal structures and failure of the surgery to accomplish its intended goals of relieving symptoms and restoring function have been discussed in detail. With this in mind we plan to proceed. I have specifically discussed with the patient the pre-and postoperative regime and the dos and don'ts and risk and benefits in great detail. Risk and benefits of surgery also include risk of dystrophy(CRPS), chronic nerve pain, failure of the healing process to go onto completion and other inherent risks of surgery The relavent the pathophysiology of the disease/injury process, as well as the alternatives for treatment and postoperative course of action has been discussed in great detail with the patient who desires to proceed.  We will do everything in our power to help you (the patient) restore function to the upper extremity. It is a pleasure to see this patient today.   Giacomo Valone MD

## 2018-09-11 NOTE — Transfer of Care (Signed)
Immediate Anesthesia Transfer of Care Note  Patient: Kevin Richardson  Procedure(s) Performed: IRRIGATION AND DEBRIDEMENT LEFT HAND (Left Finger) ARTERY, TENDON, NERVE REPAIR AS NECESSARY (Left Finger)  Patient Location: PACU  Anesthesia Type:General  Level of Consciousness: drowsy  Airway & Oxygen Therapy: Patient Spontanous Breathing and Patient connected to nasal cannula oxygen  Post-op Assessment: Report given to RN and Post -op Vital signs reviewed and stable  Post vital signs: Reviewed and stable  Last Vitals:  Vitals Value Taken Time  BP    Temp    Pulse 81 09/11/2018 10:24 AM  Resp    SpO2 99 % 09/11/2018 10:24 AM  Vitals shown include unvalidated device data.  Last Pain: There were no vitals filed for this visit.       Complications: No apparent anesthesia complications

## 2018-09-11 NOTE — Op Note (Signed)
Operative note 09/11/2018  Kevin SeverinWilliam Giamarie Bueche MD.  Preoperative diagnosis hedge tremor injury to the index and middle finger with bone tendon nerve injury on the middle finger and index finger open fracture with nailbed injury with previous repair   postop diagnosis the same   Operative procedure #1 irrigation debridement skin subtenons tissue bone and joint middle finger left hand #2 arthrotomy synovectomy with loose body removal DIP joint left middle finger #3 open treatment distal phalanx fracture left middle finger #4 flexor digitorum profundus repair left middle finger zone 1 #5 radial collateral ligament repair left middle finger #6 segmental radial digital nerve repair with 8-0 nylon right middle finger #7 dressing change right index finger  Surgeon Kevin SeverinWilliam [name]   Assistant none  Anesthesia General  Estimated blood loss minimal  Tourniquet time less than an hour  Operative procedure: Patient was seen by myself and anesthesia taken to the operative theater and underwent smooth induction of general anesthesia he was prepped with Hibiclens pre-scrub followed by 10-minute surgical Betadine scrub and paint once this was accomplished the patient then underwent a timeout.  Preoperative Ancef was given without incident.  The operation commenced with dressing change and irrigation to the index finger.  This previously had undergone an open fracture treatment with irrigation debridement with nail plate removal nailbed repair.  At the end of the case I placed Adaptic on the eponychial fold and all went quite well.  There were no complicating features.  This time I turned tissue towards middle finger I performed irrigation debridement of skin subtenons tissue bone and tendon.  This was an excisional debridement with curette knife and scissor.  Following this 3 L of irrigant were placed through and through.  Following this I performed an arthrotomy and synovectomy about the DIP joint of the  middle finger.  There was fracture fragments and cartilaginous chip in this area.  This was retrieved removed and arthrotomy synovectomy was carried out without difficulty.  Following this I then performed a open treatment of the distal phalanx fracture with setting technique and excision of some additional bony fragments attached to the tendon architecture.  Following this I then performed a very careful and cautious approach to the flexor digitorum profundus with 4-0 FiberWire repair utilizing a modified Kessler to [employer] technique.  This was a 50% laceration thus I repaired it.  The ulnar aspect of the FDP was intact.  I then performed a radial collateral ligament repair.  The radial collateral ligament was repaired with FiberWire suture as well.  He was highly unstable with any ulnar stress testing and thus we repaired the ligament.  Following this I performed a tenodesis effect.  The collateral ligament tendon repair looked excellent and there were no complicating features.  Once this was complete I then performed a segmental nerve repair.  He had 2 large gaping wounds and I repaired the radial digital nerve at both locales with 8-0 nylon.  These 2 large wounds were used as windows and the segment in between the 2 lacerations had a free-floating nerve fragment.  This I anastomose the nerve with 8-0 nylon through the 2 separate large lacerations.  Prior to doing so I did remove portions of nondilated skin tissue and the patient tolerated this well.  I then deflated the tourniquet and noted hemostasis to be stable.  I then closed the wound with 5-0 Prolene.  A closure went without difficulty and there were no complicating features.  Good refill was noted.  Adaptic Mepitel  Xeroform 4 x 4 gauze sponge and splint was applied without difficulty there were no complicating features.  He will be discharged home on Bactrim DS 1 p.o. twice daily and Norco.  He will see me back in the office should any  problems occur immediately otherwise we will see him in 10 to 14 days put him on a drain program for a week and then begin some active range of motion at 3 and 4:30 weeks.  I think he is going to do quite well but do expect some permanent numbness about the radial aspect of the middle finger.  I discussed all issues with his wife.  All questions have been addressed.  Kevin Severin MD

## 2018-09-12 ENCOUNTER — Encounter (HOSPITAL_COMMUNITY): Payer: Self-pay | Admitting: Orthopedic Surgery

## 2018-09-14 DIAGNOSIS — E7800 Pure hypercholesterolemia, unspecified: Secondary | ICD-10-CM | POA: Diagnosis not present

## 2018-09-14 DIAGNOSIS — E1169 Type 2 diabetes mellitus with other specified complication: Secondary | ICD-10-CM | POA: Diagnosis not present

## 2018-09-14 DIAGNOSIS — I10 Essential (primary) hypertension: Secondary | ICD-10-CM | POA: Diagnosis not present

## 2018-09-26 DIAGNOSIS — M79642 Pain in left hand: Secondary | ICD-10-CM | POA: Diagnosis not present

## 2018-09-26 DIAGNOSIS — M25642 Stiffness of left hand, not elsewhere classified: Secondary | ICD-10-CM | POA: Diagnosis not present

## 2018-10-11 DIAGNOSIS — M25642 Stiffness of left hand, not elsewhere classified: Secondary | ICD-10-CM | POA: Diagnosis not present

## 2018-10-18 DIAGNOSIS — E113293 Type 2 diabetes mellitus with mild nonproliferative diabetic retinopathy without macular edema, bilateral: Secondary | ICD-10-CM | POA: Diagnosis not present

## 2018-10-18 DIAGNOSIS — H35033 Hypertensive retinopathy, bilateral: Secondary | ICD-10-CM | POA: Diagnosis not present

## 2018-10-18 DIAGNOSIS — E1169 Type 2 diabetes mellitus with other specified complication: Secondary | ICD-10-CM | POA: Diagnosis not present

## 2018-10-18 DIAGNOSIS — S90212A Contusion of left great toe with damage to nail, initial encounter: Secondary | ICD-10-CM | POA: Diagnosis not present

## 2018-10-18 DIAGNOSIS — H2513 Age-related nuclear cataract, bilateral: Secondary | ICD-10-CM | POA: Diagnosis not present

## 2018-10-19 DIAGNOSIS — S62633D Displaced fracture of distal phalanx of left middle finger, subsequent encounter for fracture with routine healing: Secondary | ICD-10-CM | POA: Diagnosis not present

## 2018-10-25 DIAGNOSIS — M25642 Stiffness of left hand, not elsewhere classified: Secondary | ICD-10-CM | POA: Diagnosis not present

## 2018-10-26 ENCOUNTER — Encounter (INDEPENDENT_AMBULATORY_CARE_PROVIDER_SITE_OTHER): Payer: Federal, State, Local not specified - PPO | Admitting: Ophthalmology

## 2018-10-26 ENCOUNTER — Other Ambulatory Visit: Payer: Self-pay

## 2018-10-26 DIAGNOSIS — E113393 Type 2 diabetes mellitus with moderate nonproliferative diabetic retinopathy without macular edema, bilateral: Secondary | ICD-10-CM | POA: Diagnosis not present

## 2018-10-26 DIAGNOSIS — H43813 Vitreous degeneration, bilateral: Secondary | ICD-10-CM

## 2018-10-26 DIAGNOSIS — I10 Essential (primary) hypertension: Secondary | ICD-10-CM

## 2018-10-26 DIAGNOSIS — E11319 Type 2 diabetes mellitus with unspecified diabetic retinopathy without macular edema: Secondary | ICD-10-CM | POA: Diagnosis not present

## 2018-10-26 DIAGNOSIS — H35033 Hypertensive retinopathy, bilateral: Secondary | ICD-10-CM | POA: Diagnosis not present

## 2018-11-23 DIAGNOSIS — S62633D Displaced fracture of distal phalanx of left middle finger, subsequent encounter for fracture with routine healing: Secondary | ICD-10-CM | POA: Diagnosis not present

## 2019-01-13 DIAGNOSIS — E1351 Other specified diabetes mellitus with diabetic peripheral angiopathy without gangrene: Secondary | ICD-10-CM | POA: Diagnosis not present

## 2019-01-13 DIAGNOSIS — M21961 Unspecified acquired deformity of right lower leg: Secondary | ICD-10-CM | POA: Diagnosis not present

## 2019-01-13 DIAGNOSIS — M21962 Unspecified acquired deformity of left lower leg: Secondary | ICD-10-CM | POA: Diagnosis not present

## 2019-01-13 DIAGNOSIS — L602 Onychogryphosis: Secondary | ICD-10-CM | POA: Diagnosis not present

## 2019-01-20 DIAGNOSIS — L603 Nail dystrophy: Secondary | ICD-10-CM | POA: Diagnosis not present

## 2019-02-06 DIAGNOSIS — B353 Tinea pedis: Secondary | ICD-10-CM | POA: Diagnosis not present

## 2019-02-06 DIAGNOSIS — L602 Onychogryphosis: Secondary | ICD-10-CM | POA: Diagnosis not present

## 2019-02-06 DIAGNOSIS — E1351 Other specified diabetes mellitus with diabetic peripheral angiopathy without gangrene: Secondary | ICD-10-CM | POA: Diagnosis not present

## 2019-02-06 DIAGNOSIS — G603 Idiopathic progressive neuropathy: Secondary | ICD-10-CM | POA: Diagnosis not present

## 2019-02-14 DIAGNOSIS — G629 Polyneuropathy, unspecified: Secondary | ICD-10-CM | POA: Diagnosis not present

## 2019-02-21 ENCOUNTER — Other Ambulatory Visit: Payer: Self-pay | Admitting: Cardiology

## 2019-02-21 DIAGNOSIS — Z20822 Contact with and (suspected) exposure to covid-19: Secondary | ICD-10-CM

## 2019-02-23 LAB — NOVEL CORONAVIRUS, NAA: SARS-CoV-2, NAA: NOT DETECTED

## 2019-03-03 DIAGNOSIS — M21962 Unspecified acquired deformity of left lower leg: Secondary | ICD-10-CM | POA: Diagnosis not present

## 2019-03-03 DIAGNOSIS — G603 Idiopathic progressive neuropathy: Secondary | ICD-10-CM | POA: Diagnosis not present

## 2019-03-03 DIAGNOSIS — E1351 Other specified diabetes mellitus with diabetic peripheral angiopathy without gangrene: Secondary | ICD-10-CM | POA: Diagnosis not present

## 2019-03-03 DIAGNOSIS — M21961 Unspecified acquired deformity of right lower leg: Secondary | ICD-10-CM | POA: Diagnosis not present

## 2019-03-17 DIAGNOSIS — Z125 Encounter for screening for malignant neoplasm of prostate: Secondary | ICD-10-CM | POA: Diagnosis not present

## 2019-03-17 DIAGNOSIS — E1140 Type 2 diabetes mellitus with diabetic neuropathy, unspecified: Secondary | ICD-10-CM | POA: Diagnosis not present

## 2019-03-17 DIAGNOSIS — Z794 Long term (current) use of insulin: Secondary | ICD-10-CM | POA: Diagnosis not present

## 2019-03-17 DIAGNOSIS — Z23 Encounter for immunization: Secondary | ICD-10-CM | POA: Diagnosis not present

## 2019-03-17 DIAGNOSIS — Z0000 Encounter for general adult medical examination without abnormal findings: Secondary | ICD-10-CM | POA: Diagnosis not present

## 2019-03-17 DIAGNOSIS — E7800 Pure hypercholesterolemia, unspecified: Secondary | ICD-10-CM | POA: Diagnosis not present

## 2019-03-24 ENCOUNTER — Other Ambulatory Visit: Payer: Self-pay

## 2019-03-24 DIAGNOSIS — Z20822 Contact with and (suspected) exposure to covid-19: Secondary | ICD-10-CM

## 2019-03-25 LAB — NOVEL CORONAVIRUS, NAA: SARS-CoV-2, NAA: NOT DETECTED

## 2019-04-11 ENCOUNTER — Encounter (HOSPITAL_COMMUNITY): Payer: Self-pay | Admitting: Emergency Medicine

## 2019-04-11 ENCOUNTER — Emergency Department (HOSPITAL_COMMUNITY)
Admission: EM | Admit: 2019-04-11 | Discharge: 2019-04-12 | Disposition: A | Discharge status: Home / Self Care | Payer: Federal, State, Local not specified - PPO | Attending: Emergency Medicine | Admitting: Emergency Medicine

## 2019-04-11 DIAGNOSIS — R1084 Generalized abdominal pain: Secondary | ICD-10-CM | POA: Diagnosis not present

## 2019-04-11 DIAGNOSIS — I10 Essential (primary) hypertension: Secondary | ICD-10-CM | POA: Diagnosis not present

## 2019-04-11 DIAGNOSIS — R109 Unspecified abdominal pain: Secondary | ICD-10-CM | POA: Diagnosis not present

## 2019-04-11 DIAGNOSIS — R110 Nausea: Secondary | ICD-10-CM | POA: Diagnosis not present

## 2019-04-11 DIAGNOSIS — Z79899 Other long term (current) drug therapy: Secondary | ICD-10-CM | POA: Insufficient documentation

## 2019-04-11 DIAGNOSIS — K529 Noninfective gastroenteritis and colitis, unspecified: Secondary | ICD-10-CM | POA: Insufficient documentation

## 2019-04-11 DIAGNOSIS — Z794 Long term (current) use of insulin: Secondary | ICD-10-CM | POA: Insufficient documentation

## 2019-04-11 DIAGNOSIS — R197 Diarrhea, unspecified: Secondary | ICD-10-CM | POA: Diagnosis not present

## 2019-04-11 DIAGNOSIS — E119 Type 2 diabetes mellitus without complications: Secondary | ICD-10-CM | POA: Insufficient documentation

## 2019-04-11 LAB — CBC WITH DIFFERENTIAL/PLATELET
Abs Immature Granulocytes: 0.04 10*3/uL (ref 0.00–0.07)
Basophils Absolute: 0 10*3/uL (ref 0.0–0.1)
Basophils Relative: 0 %
Eosinophils Absolute: 0 10*3/uL (ref 0.0–0.5)
Eosinophils Relative: 0 %
HCT: 47.5 % (ref 39.0–52.0)
Hemoglobin: 15.4 g/dL (ref 13.0–17.0)
Immature Granulocytes: 0 %
Lymphocytes Relative: 10 %
Lymphs Abs: 1 10*3/uL (ref 0.7–4.0)
MCH: 27.3 pg (ref 26.0–34.0)
MCHC: 32.4 g/dL (ref 30.0–36.0)
MCV: 84.1 fL (ref 80.0–100.0)
Monocytes Absolute: 0.4 10*3/uL (ref 0.1–1.0)
Monocytes Relative: 4 %
Neutro Abs: 8.1 10*3/uL — ABNORMAL HIGH (ref 1.7–7.7)
Neutrophils Relative %: 86 %
Platelets: 319 10*3/uL (ref 150–400)
RBC: 5.65 MIL/uL (ref 4.22–5.81)
RDW: 12.3 % (ref 11.5–15.5)
WBC: 9.5 10*3/uL (ref 4.0–10.5)
nRBC: 0 % (ref 0.0–0.2)

## 2019-04-11 LAB — COMPREHENSIVE METABOLIC PANEL
ALT: 35 U/L (ref 0–44)
AST: 32 U/L (ref 15–41)
Albumin: 3.8 g/dL (ref 3.5–5.0)
Alkaline Phosphatase: 63 U/L (ref 38–126)
Anion gap: 11 (ref 5–15)
BUN: 10 mg/dL (ref 6–20)
CO2: 27 mmol/L (ref 22–32)
Calcium: 9.6 mg/dL (ref 8.9–10.3)
Chloride: 100 mmol/L (ref 98–111)
Creatinine, Ser: 1.06 mg/dL (ref 0.61–1.24)
GFR calc Af Amer: >60 mL/min (ref >60)
GFR calc non Af Amer: >60 mL/min (ref >60)
Glucose, Bld: 157 mg/dL — ABNORMAL HIGH (ref 70–99)
Potassium: 3.9 mmol/L (ref 3.5–5.1)
Sodium: 138 mmol/L (ref 135–145)
Total Bilirubin: 0.7 mg/dL (ref 0.3–1.2)
Total Protein: 7.2 g/dL (ref 6.5–8.1)

## 2019-04-11 LAB — URINALYSIS, ROUTINE W REFLEX MICROSCOPIC
Bacteria, UA: NONE SEEN
Bilirubin Urine: NEGATIVE
Glucose, UA: NEGATIVE mg/dL
Hgb urine dipstick: NEGATIVE
Ketones, ur: 80 mg/dL — AB
Leukocytes,Ua: NEGATIVE
Nitrite: NEGATIVE
Protein, ur: 30 mg/dL — AB
Specific Gravity, Urine: 1.026 (ref 1.005–1.030)
pH: 6 (ref 5.0–8.0)

## 2019-04-11 LAB — LIPASE, BLOOD: Lipase: 25 U/L (ref 11–51)

## 2019-04-11 NOTE — ED Triage Notes (Signed)
Pt here from home with c/o lower abd pain along with some n/v , pt does have a history of diverticulosis  Back in the 90's pt did have a smoothie with strawberries in it yesterday

## 2019-04-12 ENCOUNTER — Other Ambulatory Visit: Payer: Self-pay

## 2019-04-12 ENCOUNTER — Encounter (HOSPITAL_COMMUNITY): Payer: Self-pay | Admitting: Radiology

## 2019-04-12 ENCOUNTER — Emergency Department (HOSPITAL_COMMUNITY): Payer: Federal, State, Local not specified - PPO

## 2019-04-12 DIAGNOSIS — K529 Noninfective gastroenteritis and colitis, unspecified: Secondary | ICD-10-CM | POA: Diagnosis not present

## 2019-04-12 DIAGNOSIS — R109 Unspecified abdominal pain: Secondary | ICD-10-CM | POA: Diagnosis not present

## 2019-04-12 MED ORDER — ONDANSETRON HCL 4 MG/2ML IJ SOLN
4.0000 mg | Freq: Once | INTRAMUSCULAR | Status: AC
  Administered 2019-04-12: 4 mg via INTRAVENOUS
  Filled 2019-04-12: qty 2

## 2019-04-12 MED ORDER — IOHEXOL 300 MG/ML  SOLN
100.0000 mL | Freq: Once | INTRAMUSCULAR | Status: AC | PRN
  Administered 2019-04-12: 100 mL via INTRAVENOUS

## 2019-04-12 MED ORDER — CIPROFLOXACIN IN D5W 400 MG/200ML IV SOLN
400.0000 mg | Freq: Once | INTRAVENOUS | Status: AC
  Administered 2019-04-12: 400 mg via INTRAVENOUS
  Filled 2019-04-12: qty 200

## 2019-04-12 MED ORDER — HYDROCODONE-ACETAMINOPHEN 5-325 MG PO TABS: 1.0000 | ORAL_TABLET | ORAL | 0 refills | Status: DC | PRN

## 2019-04-12 MED ORDER — METRONIDAZOLE 500 MG PO TABS: 500.0000 mg | ORAL_TABLET | Freq: Three times a day (TID) | ORAL | 0 refills | Status: DC

## 2019-04-12 MED ORDER — SODIUM CHLORIDE 0.9 % IV BOLUS
1000.0000 mL | Freq: Once | INTRAVENOUS | Status: AC
  Administered 2019-04-12: 1000 mL via INTRAVENOUS

## 2019-04-12 MED ORDER — CIPROFLOXACIN HCL 500 MG PO TABS: 500.0000 mg | ORAL_TABLET | Freq: Two times a day (BID) | ORAL | 0 refills | Status: DC

## 2019-04-12 MED ORDER — MORPHINE SULFATE (PF) 4 MG/ML IV SOLN
4.0000 mg | Freq: Once | INTRAVENOUS | Status: AC
  Administered 2019-04-12: 4 mg via INTRAVENOUS
  Filled 2019-04-12: qty 1

## 2019-04-12 MED ORDER — METRONIDAZOLE 500 MG PO TABS
500.0000 mg | ORAL_TABLET | Freq: Once | ORAL | Status: AC
  Administered 2019-04-12: 500 mg via ORAL
  Filled 2019-04-12: qty 1

## 2019-04-12 NOTE — ED Notes (Signed)
Patient verbalizes understanding of discharge instructions. Opportunity for questioning and answers were provided. Armband removed by staff, pt discharged from ED.  

## 2019-04-12 NOTE — ED Provider Notes (Signed)
[facility] EMERGENCY DEPARTMENT Provider Note   CSN: [id number] Arrival date & time: 04/11/19  1445     History No chief complaint on file.   Kevin Richardson is a 48 y.o. male.  Patient presents to the emergency department for evaluation of abdominal pain.  Patient reports that symptoms began 2 days ago.  He has been having diffuse mid abdominal pain and cramping with diarrhea.  Patient reports that after multiple episodes of diarrhea he did see some red blood in his stool as well.  He has developed nausea today with increasing pain.  No fever.  Patient reports that he was told he might of had a diverticulitis episode many years ago, but ultimately was diagnosed with gallbladder disease and had his gallbladder removed.  He has not had any further episodes before this week.  He does have a brother with Crohn's disease.  He is supposed to be seeing his primary physician to be scheduled for GI follow-up and colonoscopy.        Past Medical History:  Diagnosis Date  . Diabetes mellitus without complication ([place])   . Hypertension     There are no problems to display for this patient.   Past Surgical History:  Procedure Laterality Date  . ARTERY AND TENDON REPAIR Left 09/11/2018   Procedure: ARTERY, TENDON, NERVE REPAIR AS NECESSARY;  Surgeon: Roseanne Kaufman, MD;  Location: [facility];  Service: Orthopedics;  Laterality: Left;  . GALLBLADDER SURGERY    . I & D EXTREMITY Left 09/11/2018   Procedure: IRRIGATION AND DEBRIDEMENT LEFT HAND;  Surgeon: Roseanne Kaufman, MD;  Location: [facility];  Service: Orthopedics;  Laterality: Left;       No family history on file.  Social History   Tobacco Use  . Smoking status: Never Smoker  . Smokeless tobacco: Never Used  Substance Use Topics  . Alcohol use: Never  . Drug use: Never    Home Medications Prior to Admission medications   Medication Sig Start Date End Date Taking? Authorizing Provider  atorvastatin (LIPITOR) 10 MG  tablet Take 10 mg by mouth daily. 05/22/18   [provider]  budesonide (RHINOCORT ALLERGY) 32 MCG/ACT nasal spray Place 2 sprays into both nostrils daily.    [provider]  Calcium-Magnesium-Vitamin D (CALCIUM MAGNESIUM PO) Take 1 tablet by mouth daily.    [provider]  cetirizine (ZYRTEC) 10 MG tablet Take 10 mg by mouth daily.    [provider]  ciprofloxacin (CIPRO) 500 MG tablet Take 1 tablet (500 mg total) by mouth 2 (two) times daily. 04/12/19   Orpah Greek, MD  Cyanocobalamin (VITAMIN B12 PO) Take 1 tablet by mouth daily.    [provider]  doxycycline (VIBRAMYCIN) 100 MG capsule Take 1 capsule (100 mg total) by mouth 2 (two) times daily. Patient not taking: Reported on 09/11/2018 06/20/18   Ok Edwards, PA-C  hydrochlorothiazide (HYDRODIURIL) 25 MG tablet Take 25 mg by mouth daily. 04/20/18   [provider]  HYDROcodone-acetaminophen (NORCO/VICODIN) 5-325 MG tablet Take 1 tablet by mouth every 4 (four) hours as needed for moderate pain. 04/12/19   Orpah Greek, MD  insulin aspart (NOVOLOG) 100 UNIT/ML FlexPen Inject 8-13 Units into the skin 3 (three) times daily. 10/29/15   [provider]  Insulin Glargine (BASAGLAR KWIKPEN) 100 UNIT/ML SOPN Inject 40 Units into the skin every evening. 05/22/18   [provider]  ipratropium (ATROVENT) 0.06 % nasal spray Place 2 sprays into  both nostrils 4 (four) times daily. Patient not taking: Reported on 09/11/2018 06/20/18   Belinda FisherYu, Amy V, PA-C  lisinopril (ZESTRIL) 40 MG tablet Take 40 mg by mouth daily. 05/23/18   [provider]  metFORMIN (GLUCOPHAGE) 1000 MG tablet Take 1,000 mg by mouth 2 (two) times daily with a meal.  04/21/18   [provider]  metroNIDAZOLE (FLAGYL) 500 MG tablet Take 1 tablet (500 mg total) by mouth 3 (three) times daily. 04/12/19   Gilda CreasePollina, Rook Maue J, MD  Misc Natural Products (PROSTATE HEALTH PO) Take 1 tablet by mouth  daily.    [provider]  Multiple Vitamins-Minerals (MULTIVITAMIN WITH MINERALS) tablet Take 1 tablet by mouth daily.    [provider]  omega-3 acid ethyl esters (LOVAZA) 1 g capsule Take 1 g by mouth daily.    [provider]  TURMERIC PO Take 1 tablet by mouth daily.    [provider]    Allergies    Patient has no known allergies.  Review of Systems   Review of Systems  Gastrointestinal: Positive for abdominal pain, blood in stool, diarrhea and nausea.  All other systems reviewed and are negative.   Physical Exam Updated Vital Signs BP 134/88   Pulse 78   Temp 97.9 F (36.6 C) (Oral)   Resp 18   Ht 6\' 1"  (1.854 m)   Wt 107 kg   SpO2 98%   BMI 31.14 kg/m   Physical Exam Vitals and nursing note reviewed.  Constitutional:      General: He is not in acute distress.    Appearance: Normal appearance. He is well-developed.  HENT:     Head: Normocephalic and atraumatic.     Right Ear: Hearing normal.     Left Ear: Hearing normal.     Nose: Nose normal.  Eyes:     Conjunctiva/sclera: Conjunctivae normal.     Pupils: Pupils are equal, round, and reactive to light.  Cardiovascular:     Rate and Rhythm: Regular rhythm.     Heart sounds: S1 normal and S2 normal. No murmur. No friction rub. No gallop.   Pulmonary:     Effort: Pulmonary effort is normal. No respiratory distress.     Breath sounds: Normal breath sounds.  Chest:     Chest wall: No tenderness.  Abdominal:     General: Bowel sounds are normal.     Palpations: Abdomen is soft.     Tenderness: There is generalized abdominal tenderness. There is no guarding or rebound. Negative signs include Murphy's sign and McBurney's sign.     Hernia: No hernia is present.  Musculoskeletal:        General: Normal range of motion.     Cervical back: Normal range of motion and neck supple.  Skin:    General: Skin is warm and dry.     Findings: No rash.  Neurological:     Mental  Status: He is alert and oriented to person, place, and time.     GCS: GCS eye subscore is 4. GCS verbal subscore is 5. GCS motor subscore is 6.     Cranial Nerves: No cranial nerve deficit.     Sensory: No sensory deficit.     Coordination: Coordination normal.  Psychiatric:        Speech: Speech normal.        Behavior: Behavior normal.        Thought Content: Thought content normal.     ED Results /  Procedures / Treatments   Labs (all labs ordered are listed, but only abnormal results are displayed) Labs Reviewed  COMPREHENSIVE METABOLIC PANEL - Abnormal; Notable for the following components:      Result Value   Glucose, Bld 157 (*)    All other components within normal limits  CBC WITH DIFFERENTIAL/PLATELET - Abnormal; Notable for the following components:   Neutro Abs 8.1 (*)    All other components within normal limits  URINALYSIS, ROUTINE W REFLEX MICROSCOPIC - Abnormal; Notable for the following components:   Ketones, ur 80 (*)    Protein, ur 30 (*)    All other components within normal limits  LIPASE, BLOOD    EKG None  Radiology CT ABDOMEN PELVIS W CONTRAST  Result Date: 04/12/2019 CLINICAL DATA:  Lower abdominal pain with nausea. Diverticulitis suspected. EXAM: CT ABDOMEN AND PELVIS WITH CONTRAST TECHNIQUE: Multidetector CT imaging of the abdomen and pelvis was performed using the standard protocol following bolus administration of intravenous contrast. CONTRAST:  OMNIPAQUE IOHEXOL 300 MG/ML  SOLN COMPARISON:  None. FINDINGS: Lower chest: Minor dependent atelectasis in the right greater than left lower lobe. Mild eventration of right hemidiaphragm. No pleural fluid. Prominent right infrahilar lymph node measuring 9 mm. Hepatobiliary: Ill-defined 11 mm hypodensity in the right lobe of the liver, incompletely characterized. Postcholecystectomy. No biliary dilatation. Pancreas: No ductal dilatation or inflammation. Spleen: Normal in size without focal abnormality.  Adrenals/Urinary Tract: Normal adrenal glands. No hydronephrosis or perinephric edema. Homogeneous renal enhancement with symmetric excretion on delayed phase imaging. Urinary bladder is partially distended without wall thickening. Stomach/Bowel: Long length segment of colonic wall thickening and pericolonic edema extending from the cecum to the distal descending colon. Mild wall thickening and perienteric edema also involving the distal and terminal ileum. Minimal sigmoid colonic diverticulosis without focal diverticulitis. No bowel obstruction. Other than terminal ileal inflammation, small-bowel is unremarkable. Stomach is nondistended. The appendix is normal. Vascular/Lymphatic: Mild aortic atherosclerosis. No aortic aneurysm. The portal vein is patent. Mesenteric vessels are patent. Prominent ileocolic nodes are likely reactive. Reproductive: Enlarged prostate gland spanning 5.9 cm transverse. Other: Pericolonic edema with free fluid in the right greater than left pericolic gutters and tracking in the pelvis. There is no free air. No loculated fluid collection. Small fat containing umbilical hernia. Musculoskeletal: There are no acute or suspicious osseous abnormalities. Degenerative change at L5-S1 with vacuum phenomenon. IMPRESSION: 1. Long segment colitis with colonic wall thickening and pericolonic edema extending from the cecum to the distal descending colon. There is also a low distal/terminal ileal inflammation. Findings may be infectious or inflammatory bowel disease such as Crohn's. Small amount of free fluid in the pericolic gutters tracks into the pelvis. No perforation. 2. Ill-defined 11 mm hypodensity in the right lobe of the liver, incompletely characterized. This may represent a hemangioma, but is nonspecific. No prior exams available to evaluate for stability. Given poorly margins, consider further evaluation with hepatic protocol MRI on an elective basis. 3. Incidental findings include minimal  colonic diverticulosis without diverticulitis and enlarged prostate gland. Aortic Atherosclerosis (ICD10-I70.0). Electronically Signed   By: Narda Rutherford M.D.   On: 04/12/2019 04:39    Procedures Procedures (including critical care time)  Medications Ordered in ED Medications  ciprofloxacin (CIPRO) IVPB 400 mg (has no administration in time range)  metroNIDAZOLE (FLAGYL) tablet 500 mg (has no administration in time range)  sodium chloride 0.9 % bolus 1,000 mL (1,000 mLs Intravenous New Bag/Given 04/12/19 0349)  morphine 4 MG/ML injection 4 mg (  4 mg Intravenous Given 04/12/19 0349)  ondansetron (ZOFRAN) injection 4 mg (4 mg Intravenous Given 04/12/19 0348)  iohexol (OMNIPAQUE) 300 MG/ML solution 100 mL (100 mLs Intravenous Contrast Given 04/12/19 0427)    ED Course  I have reviewed the triage vital signs and the nursing notes.  Pertinent labs & imaging results that were available during my care of the patient were reviewed by me and considered in my medical decision making (see chart for details).    MDM Rules/Calculators/A&P                      Patient presents to the emergency department for evaluation of abdominal pain.  Symptoms ongoing for 2 days.  He has had nausea, diarrhea, rectal bleeding.  Examination revealed diffuse tenderness without focal tenderness, guarding or rebound.  Lab work was unremarkable.  Patient underwent CT scan.  This does show a long segment colitis.  We will treat for infectious etiology, but he does have family history of inflammatory bowel disease.  Patient reports that his primary doctor was already working on getting him follow-up with GI for colonoscopy based on his family history and age.  He admits that he has been putting this off.  Will need to follow-up with GI, return if symptoms worsening. Final Clinical Impression(s) / ED Diagnoses Final diagnoses:  Colitis    Rx / DC Orders ED Discharge Orders         Ordered    ciprofloxacin (CIPRO)  500 MG tablet  2 times daily     04/12/19 0453    metroNIDAZOLE (FLAGYL) 500 MG tablet  3 times daily     04/12/19 0453    HYDROcodone-acetaminophen (NORCO/VICODIN) 5-325 MG tablet  Every 4 hours PRN     04/12/19 0453           Gilda Crease, MD 04/12/19 [phone]

## 2019-04-12 NOTE — ED Notes (Addendum)
Patient transported to CT 

## 2019-04-12 NOTE — ED Notes (Signed)
Patient is resting comfortably. Vital signs stable. 

## 2019-04-20 DIAGNOSIS — I10 Essential (primary) hypertension: Secondary | ICD-10-CM | POA: Diagnosis not present

## 2019-04-20 DIAGNOSIS — K529 Noninfective gastroenteritis and colitis, unspecified: Secondary | ICD-10-CM | POA: Diagnosis not present

## 2019-04-20 DIAGNOSIS — R935 Abnormal findings on diagnostic imaging of other abdominal regions, including retroperitoneum: Secondary | ICD-10-CM | POA: Diagnosis not present

## 2019-04-27 DIAGNOSIS — R9389 Abnormal findings on diagnostic imaging of other specified body structures: Secondary | ICD-10-CM | POA: Diagnosis not present

## 2019-04-28 ENCOUNTER — Encounter (INDEPENDENT_AMBULATORY_CARE_PROVIDER_SITE_OTHER): Payer: Federal, State, Local not specified - PPO | Admitting: Ophthalmology

## 2019-04-28 DIAGNOSIS — H2513 Age-related nuclear cataract, bilateral: Secondary | ICD-10-CM

## 2019-04-28 DIAGNOSIS — I10 Essential (primary) hypertension: Secondary | ICD-10-CM | POA: Diagnosis not present

## 2019-04-28 DIAGNOSIS — E103291 Type 1 diabetes mellitus with mild nonproliferative diabetic retinopathy without macular edema, right eye: Secondary | ICD-10-CM

## 2019-04-28 DIAGNOSIS — H35033 Hypertensive retinopathy, bilateral: Secondary | ICD-10-CM

## 2019-04-28 DIAGNOSIS — E10319 Type 1 diabetes mellitus with unspecified diabetic retinopathy without macular edema: Secondary | ICD-10-CM

## 2019-04-28 DIAGNOSIS — E103392 Type 1 diabetes mellitus with moderate nonproliferative diabetic retinopathy without macular edema, left eye: Secondary | ICD-10-CM | POA: Diagnosis not present

## 2019-04-28 DIAGNOSIS — H43813 Vitreous degeneration, bilateral: Secondary | ICD-10-CM

## 2019-05-10 ENCOUNTER — Other Ambulatory Visit: Payer: Self-pay | Admitting: Gastroenterology

## 2019-05-10 DIAGNOSIS — R9389 Abnormal findings on diagnostic imaging of other specified body structures: Secondary | ICD-10-CM

## 2019-05-15 ENCOUNTER — Ambulatory Visit
Admission: RE | Admit: 2019-05-15 | Discharge: 2019-05-15 | Disposition: A | Discharge status: Home / Self Care | Payer: Federal, State, Local not specified - PPO | Source: Ambulatory Visit | Attending: Gastroenterology | Admitting: Gastroenterology

## 2019-05-15 ENCOUNTER — Other Ambulatory Visit: Payer: Self-pay

## 2019-05-15 DIAGNOSIS — Z1159 Encounter for screening for other viral diseases: Secondary | ICD-10-CM | POA: Diagnosis not present

## 2019-05-15 DIAGNOSIS — K7689 Other specified diseases of liver: Secondary | ICD-10-CM | POA: Diagnosis not present

## 2019-05-15 DIAGNOSIS — R9389 Abnormal findings on diagnostic imaging of other specified body structures: Secondary | ICD-10-CM

## 2019-05-15 MED ORDER — GADOBENATE DIMEGLUMINE 529 MG/ML IV SOLN
20.0000 mL | Freq: Once | INTRAVENOUS | Status: AC | PRN
  Administered 2019-05-15: 20 mL via INTRAVENOUS

## 2019-05-18 DIAGNOSIS — K6389 Other specified diseases of intestine: Secondary | ICD-10-CM | POA: Diagnosis not present

## 2019-05-18 DIAGNOSIS — R933 Abnormal findings on diagnostic imaging of other parts of digestive tract: Secondary | ICD-10-CM | POA: Diagnosis not present

## 2019-08-02 ENCOUNTER — Ambulatory Visit: Payer: Federal, State, Local not specified - PPO | Attending: Internal Medicine

## 2019-08-02 DIAGNOSIS — Z20822 Contact with and (suspected) exposure to covid-19: Secondary | ICD-10-CM

## 2019-08-03 LAB — SARS-COV-2, NAA 2 DAY TAT

## 2019-08-03 LAB — NOVEL CORONAVIRUS, NAA: SARS-CoV-2, NAA: NOT DETECTED

## 2019-08-18 ENCOUNTER — Other Ambulatory Visit: Payer: Self-pay | Admitting: Gastroenterology

## 2019-08-18 DIAGNOSIS — K769 Liver disease, unspecified: Secondary | ICD-10-CM

## 2019-09-28 ENCOUNTER — Ambulatory Visit
Admission: RE | Admit: 2019-09-28 | Discharge: 2019-09-28 | Disposition: A | Discharge status: Home / Self Care | Payer: Federal, State, Local not specified - PPO | Source: Ambulatory Visit | Attending: Gastroenterology | Admitting: Gastroenterology

## 2019-09-28 ENCOUNTER — Other Ambulatory Visit: Payer: Federal, State, Local not specified - PPO

## 2019-09-28 DIAGNOSIS — K7689 Other specified diseases of liver: Secondary | ICD-10-CM | POA: Diagnosis not present

## 2019-09-28 DIAGNOSIS — K769 Liver disease, unspecified: Secondary | ICD-10-CM

## 2019-09-28 MED ORDER — GADOBENATE DIMEGLUMINE 529 MG/ML IV SOLN
20.0000 mL | Freq: Once | INTRAVENOUS | Status: AC | PRN
  Administered 2019-09-28: 20 mL via INTRAVENOUS

## 2019-10-18 DIAGNOSIS — E113293 Type 2 diabetes mellitus with mild nonproliferative diabetic retinopathy without macular edema, bilateral: Secondary | ICD-10-CM | POA: Diagnosis not present

## 2019-10-18 DIAGNOSIS — H35033 Hypertensive retinopathy, bilateral: Secondary | ICD-10-CM | POA: Diagnosis not present

## 2019-10-18 DIAGNOSIS — H2513 Age-related nuclear cataract, bilateral: Secondary | ICD-10-CM | POA: Diagnosis not present

## 2019-11-01 ENCOUNTER — Other Ambulatory Visit: Payer: Self-pay

## 2019-11-01 ENCOUNTER — Encounter (INDEPENDENT_AMBULATORY_CARE_PROVIDER_SITE_OTHER): Payer: Federal, State, Local not specified - PPO | Admitting: Ophthalmology

## 2019-11-01 DIAGNOSIS — I10 Essential (primary) hypertension: Secondary | ICD-10-CM

## 2019-11-01 DIAGNOSIS — H43813 Vitreous degeneration, bilateral: Secondary | ICD-10-CM

## 2019-11-01 DIAGNOSIS — H35033 Hypertensive retinopathy, bilateral: Secondary | ICD-10-CM | POA: Diagnosis not present

## 2019-11-01 DIAGNOSIS — E11319 Type 2 diabetes mellitus with unspecified diabetic retinopathy without macular edema: Secondary | ICD-10-CM | POA: Diagnosis not present

## 2019-11-01 DIAGNOSIS — E113393 Type 2 diabetes mellitus with moderate nonproliferative diabetic retinopathy without macular edema, bilateral: Secondary | ICD-10-CM | POA: Diagnosis not present

## 2020-04-22 DIAGNOSIS — Z125 Encounter for screening for malignant neoplasm of prostate: Secondary | ICD-10-CM | POA: Diagnosis not present

## 2020-04-22 DIAGNOSIS — Z794 Long term (current) use of insulin: Secondary | ICD-10-CM | POA: Diagnosis not present

## 2020-04-22 DIAGNOSIS — E7800 Pure hypercholesterolemia, unspecified: Secondary | ICD-10-CM | POA: Diagnosis not present

## 2020-04-22 DIAGNOSIS — Z0000 Encounter for general adult medical examination without abnormal findings: Secondary | ICD-10-CM | POA: Diagnosis not present

## 2020-04-22 DIAGNOSIS — E1169 Type 2 diabetes mellitus with other specified complication: Secondary | ICD-10-CM | POA: Diagnosis not present

## 2020-04-22 DIAGNOSIS — I10 Essential (primary) hypertension: Secondary | ICD-10-CM | POA: Diagnosis not present

## 2020-05-08 ENCOUNTER — Other Ambulatory Visit: Payer: Self-pay

## 2020-05-08 ENCOUNTER — Encounter (INDEPENDENT_AMBULATORY_CARE_PROVIDER_SITE_OTHER): Payer: Federal, State, Local not specified - PPO | Admitting: Ophthalmology

## 2020-05-08 DIAGNOSIS — H2513 Age-related nuclear cataract, bilateral: Secondary | ICD-10-CM

## 2020-05-08 DIAGNOSIS — E113393 Type 2 diabetes mellitus with moderate nonproliferative diabetic retinopathy without macular edema, bilateral: Secondary | ICD-10-CM

## 2020-05-08 DIAGNOSIS — H43813 Vitreous degeneration, bilateral: Secondary | ICD-10-CM | POA: Diagnosis not present

## 2020-05-08 DIAGNOSIS — H35033 Hypertensive retinopathy, bilateral: Secondary | ICD-10-CM | POA: Diagnosis not present

## 2020-05-08 DIAGNOSIS — I10 Essential (primary) hypertension: Secondary | ICD-10-CM

## 2020-05-23 ENCOUNTER — Other Ambulatory Visit: Payer: Self-pay

## 2020-05-23 ENCOUNTER — Ambulatory Visit: Payer: Federal, State, Local not specified - PPO | Admitting: Allergy

## 2020-05-23 ENCOUNTER — Encounter: Payer: Self-pay | Admitting: Allergy

## 2020-05-23 VITALS — BP 122/80 | HR 77 | Temp 98.0°F | Resp 16 | Ht 73.0 in | Wt 257.2 lb

## 2020-05-23 DIAGNOSIS — J300 Vasomotor rhinitis: Secondary | ICD-10-CM

## 2020-05-23 DIAGNOSIS — L2089 Other atopic dermatitis: Secondary | ICD-10-CM

## 2020-05-23 DIAGNOSIS — J3089 Other allergic rhinitis: Secondary | ICD-10-CM | POA: Diagnosis not present

## 2020-05-23 NOTE — Patient Instructions (Addendum)
-  environmental allergy testing is positive to mold -allergen avoidance measures discussed/handouts provided -recommend trial of Xyzal 5mg  daily.  This is a long-acting antihistamine similar to zyrtec, claritin and allegra that you have not tried yet.   -for nasal congestion continue Rhinocort 2 sprays each nostril daily for 1 to 2 weeks at a time before stopping for maximum benefit -Discussed performing nasal saline rinses to help flush out the nose.  It is best to use during times of upper respiratory illness and prior to using your medicated nasal sprays to help them work more effectively.  Breath through your mouth the entire process.  Use either distilled water or boil water and let cool down to warm-lukewarm temperature.  -allergen immunotherapy discussed today including protocol, benefits and risk.  Informational handout provided.  If interested in this therapuetic option you can check with your insurance carrier for coverage.  Let know if you would like to proceed with this option.    -you have component of vasomotor rhinitis where the act of eating causes nasal symptoms (congestion).  This is not a food allergy.  -you can try using nasal Atrovent 2 sprays prior to eating known foods that increase nasal symptoms to see if it is prevented or lessened.   Let us know if you would like to try this.   -common food allergen skin testing is negative  -continue daily moisturization especially after bathing -continue as needed use of cortisone or hydrocortisone for eczema flares.  If this becomes ineffective let us know and can recommend another topical therapy   Follow-up in 4-6 months or sooner if needed

## 2020-05-23 NOTE — Progress Notes (Signed)
New Patient Note  RE: Kevin Richardson MRN: [id number] DOB: 1970/10/14 Date of Office Visit: 05/23/2020  Referring provider: Renford Dills, MD Primary care provider: Renford Dills, MD  Chief Complaint: allergies  History of present illness: Kevin Richardson is a 50 y.o. male presenting today for consultation for allergic rhinitis.   He reports symptoms of sinus pressure/"fuzziness in head", sneezing, congestion. Symptoms are year-round but worse in fall.  He uses zyrtec and this helps somewhat.   Uses rhinocort 2 sprays daily.   Has performed nasal saline rinses in the past years ago.   He reports sinus infection with antibiotics about 2-3 times a year max with the last 1 in 2021.    He does report has used albuterol during respiratory illness. He does report history of eczema since childhood but occurs mostly on his hands at this point in time.  Will use cortisone and moisturization for this.  He avoids pineapple and states the acidity is an issue as he has had his gallbladder removed and he does not digest it well. He states after some foods he does develop nasal congestion.     Review of systems in the past 4 weeks: Review of Systems  Constitutional: Negative.   HENT:       See HPI  Eyes: Negative.   Respiratory: Negative.   Cardiovascular: Negative.   Gastrointestinal: Negative.   Musculoskeletal: Negative.   Skin: Negative.   Neurological: Negative.     All other systems negative unless noted above in HPI  Past medical history: Past Medical History:  Diagnosis Date  . Diabetes mellitus without complication (HCC)   . Eczema   . Hypertension     Past surgical history: Past Surgical History:  Procedure Laterality Date  . ARTERY AND TENDON REPAIR Left 09/11/2018   Procedure: ARTERY, TENDON, NERVE REPAIR AS NECESSARY;  Surgeon: Dominica Severin, MD;  Location: [facility] OR;  Service: Orthopedics;  Laterality: Left;  . GALLBLADDER SURGERY    . I & D EXTREMITY Left 09/11/2018    Procedure: IRRIGATION AND DEBRIDEMENT LEFT HAND;  Surgeon: Dominica Severin, MD;  Location: [facility] OR;  Service: Orthopedics;  Laterality: Left;    Family history:  Family History  Problem Relation Age of Onset  . Asthma Mother   . Allergic rhinitis Mother     Social history: Lives in a home with carpeting in the bedroom with gas heating and central cooling.  No pets in the home.  There is no concern for water damage, mildew or roaches in the home.  He works in [employer].  He does not report a smoking history.  Medication List: Current Outpatient Medications  Medication Sig Dispense Refill  . atorvastatin (LIPITOR) 10 MG tablet Take 10 mg by mouth daily.    . budesonide (RHINOCORT AQUA) 32 MCG/ACT nasal spray Place 2 sprays into both nostrils daily.    . Calcium-Magnesium-Vitamin D (CALCIUM MAGNESIUM PO) Take 1 tablet by mouth daily.    . cetirizine (ZYRTEC) 10 MG tablet Take 10 mg by mouth daily.    . insulin aspart (NOVOLOG) 100 UNIT/ML FlexPen Inject 8-13 Units into the skin 3 (three) times daily.    . Insulin Glargine (BASAGLAR KWIKPEN) 100 UNIT/ML SOPN Inject 40 Units into the skin every evening.    Marland Kitchen lisinopril (ZESTRIL) 40 MG tablet Take 40 mg by mouth daily.    . metFORMIN (GLUCOPHAGE) 1000 MG tablet Take 1,000 mg by mouth 2 (two) times daily with a meal.     .  Misc Natural Products (PROSTATE HEALTH PO) Take 1 tablet by mouth daily.    . Multiple Vitamins-Minerals (MULTIVITAMIN WITH MINERALS) tablet Take 1 tablet by mouth daily.    Marland Kitchen omega-3 acid ethyl esters (LOVAZA) 1 g capsule Take 1 g by mouth daily.    . TURMERIC PO Take 1 tablet by mouth daily.    . ciprofloxacin (CIPRO) 500 MG tablet Take 1 tablet (500 mg total) by mouth 2 (two) times daily. (Patient not taking: Reported on 05/23/2020) 20 tablet 0  . Cyanocobalamin (VITAMIN B12 PO) Take 1 tablet by mouth daily. (Patient not taking: Reported on 05/23/2020)    . doxycycline (VIBRAMYCIN) 100 MG capsule Take 1 capsule (100 mg total) by  mouth 2 (two) times daily. (Patient not taking: No sig reported) 20 capsule 0  . hydrochlorothiazide (HYDRODIURIL) 25 MG tablet Take 25 mg by mouth daily. (Patient not taking: Reported on 05/23/2020)    . HYDROcodone-acetaminophen (NORCO/VICODIN) 5-325 MG tablet Take 1 tablet by mouth every 4 (four) hours as needed for moderate pain. (Patient not taking: Reported on 05/23/2020) 10 tablet 0  . ipratropium (ATROVENT) 0.06 % nasal spray Place 2 sprays into both nostrils 4 (four) times daily. (Patient not taking: No sig reported) 15 mL 0  . metroNIDAZOLE (FLAGYL) 500 MG tablet Take 1 tablet (500 mg total) by mouth 3 (three) times daily. (Patient not taking: Reported on 05/23/2020) 30 tablet 0   No current facility-administered medications for this visit.    Known medication allergies: No Known Allergies   Physical examination: Blood pressure 122/80, pulse 77, temperature 98 F (36.7 C), temperature source Temporal, resp. rate 16, height 6\' 1"  (1.854 m), weight 257 lb 3.2 oz (116.7 kg), SpO2 97 %.  General: Alert, interactive, in no acute distress. HEENT: PERRLA, TMs pearly gray, turbinates moderately edematous without discharge, post-pharynx non erythematous. Neck: Supple without lymphadenopathy. Lungs: Clear to auscultation without wheezing, rhonchi or rales. {no increased work of breathing. CV: Normal S1, S2 without murmurs. Abdomen: Nondistended, nontender. Skin: Warm and dry, without lesions or rashes. Extremities:  No clubbing, cyanosis or edema. Neuro:   Grossly intact.  Diagnositics/Labs:  Allergy testing: Environmental allergy skin prick testing is positive to pullulara. Intradermal testing is negative Allergy testing results were read and interpreted by provider, documented by clinical staff.   Assessment and plan: Allergic rhinitis  -environmental allergy testing is positive to mold -allergen avoidance measures discussed/handouts provided -recommend trial of Xyzal 5mg  daily.   This is a long-acting antihistamine similar to zyrtec, claritin and allegra that you have not tried yet.   -for nasal congestion continue Rhinocort 2 sprays each nostril daily for 1 to 2 weeks at a time before stopping for maximum benefit -Discussed performing nasal saline rinses to help flush out the nose.  It is best to use during times of upper respiratory illness and prior to using your medicated nasal sprays to help them work more effectively.  Breath through your mouth the entire process.  Use either distilled water or boil water and let cool down to warm-lukewarm temperature.  -allergen immunotherapy discussed today including protocol, benefits and risk.  Informational handout provided.  If interested in this therapuetic option you can check with your insurance carrier for coverage.  Let know if you would like to proceed with this option.    Vasomotor rhinitis -you have component of vasomotor rhinitis where the act of eating causes nasal symptoms (congestion).  This is not a food allergy.  -you can try using  nasal Atrovent 2 sprays prior to eating known foods that increase nasal symptoms to see if it is prevented or lessened.   Let us know if you would like to try this.   -common food allergen skin testing is negative  Atopic dermatitis -continue daily moisturization especially after bathing -continue as needed use of cortisone or hydrocortisone for eczema flares.  If this becomes ineffective let us know and can recommend another topical therapy   Follow-up in 4-6 months or sooner if needed  I appreciate the opportunity to take part in Kevin Richardson's care. Please do not hesitate to contact me with questions.  Sincerely,   Margo Aye, MD Allergy/Immunology [facility]

## 2020-09-19 ENCOUNTER — Other Ambulatory Visit: Payer: Self-pay | Admitting: Physician Assistant

## 2020-09-19 DIAGNOSIS — K769 Liver disease, unspecified: Secondary | ICD-10-CM

## 2020-10-21 DIAGNOSIS — E7800 Pure hypercholesterolemia, unspecified: Secondary | ICD-10-CM | POA: Diagnosis not present

## 2020-10-21 DIAGNOSIS — Z794 Long term (current) use of insulin: Secondary | ICD-10-CM | POA: Diagnosis not present

## 2020-10-21 DIAGNOSIS — I10 Essential (primary) hypertension: Secondary | ICD-10-CM | POA: Diagnosis not present

## 2020-10-21 DIAGNOSIS — E1140 Type 2 diabetes mellitus with diabetic neuropathy, unspecified: Secondary | ICD-10-CM | POA: Diagnosis not present

## 2020-10-24 ENCOUNTER — Ambulatory Visit: Payer: Federal, State, Local not specified - PPO | Admitting: Allergy

## 2020-11-06 ENCOUNTER — Encounter (INDEPENDENT_AMBULATORY_CARE_PROVIDER_SITE_OTHER): Payer: Federal, State, Local not specified - PPO | Admitting: Ophthalmology

## 2020-11-06 ENCOUNTER — Other Ambulatory Visit: Payer: Self-pay

## 2020-11-06 DIAGNOSIS — H43813 Vitreous degeneration, bilateral: Secondary | ICD-10-CM | POA: Diagnosis not present

## 2020-11-06 DIAGNOSIS — I10 Essential (primary) hypertension: Secondary | ICD-10-CM | POA: Diagnosis not present

## 2020-11-06 DIAGNOSIS — H35033 Hypertensive retinopathy, bilateral: Secondary | ICD-10-CM

## 2020-11-06 DIAGNOSIS — E113393 Type 2 diabetes mellitus with moderate nonproliferative diabetic retinopathy without macular edema, bilateral: Secondary | ICD-10-CM | POA: Diagnosis not present

## 2020-11-07 ENCOUNTER — Other Ambulatory Visit: Payer: Federal, State, Local not specified - PPO

## 2021-05-01 DIAGNOSIS — E1140 Type 2 diabetes mellitus with diabetic neuropathy, unspecified: Secondary | ICD-10-CM | POA: Diagnosis not present

## 2021-05-01 DIAGNOSIS — E7800 Pure hypercholesterolemia, unspecified: Secondary | ICD-10-CM | POA: Diagnosis not present

## 2021-05-01 DIAGNOSIS — I10 Essential (primary) hypertension: Secondary | ICD-10-CM | POA: Diagnosis not present

## 2021-05-01 DIAGNOSIS — Z0000 Encounter for general adult medical examination without abnormal findings: Secondary | ICD-10-CM | POA: Diagnosis not present

## 2021-05-01 DIAGNOSIS — Z125 Encounter for screening for malignant neoplasm of prostate: Secondary | ICD-10-CM | POA: Diagnosis not present

## 2021-05-04 ENCOUNTER — Ambulatory Visit
Admission: RE | Admit: 2021-05-04 | Discharge: 2021-05-04 | Disposition: A | Discharge status: Home / Self Care | Payer: Federal, State, Local not specified - PPO | Source: Ambulatory Visit | Attending: Physician Assistant | Admitting: Physician Assistant

## 2021-05-04 ENCOUNTER — Other Ambulatory Visit: Payer: Self-pay

## 2021-05-04 DIAGNOSIS — K760 Fatty (change of) liver, not elsewhere classified: Secondary | ICD-10-CM | POA: Diagnosis not present

## 2021-05-04 DIAGNOSIS — K769 Liver disease, unspecified: Secondary | ICD-10-CM

## 2021-05-04 MED ORDER — GADOBENATE DIMEGLUMINE 529 MG/ML IV SOLN
19.0000 mL | Freq: Once | INTRAVENOUS | Status: AC | PRN
  Administered 2021-05-04: 19 mL via INTRAVENOUS

## 2021-05-09 ENCOUNTER — Encounter (INDEPENDENT_AMBULATORY_CARE_PROVIDER_SITE_OTHER): Payer: Federal, State, Local not specified - PPO | Admitting: Ophthalmology

## 2021-05-12 DIAGNOSIS — D72819 Decreased white blood cell count, unspecified: Secondary | ICD-10-CM | POA: Diagnosis not present

## 2021-05-12 DIAGNOSIS — E1140 Type 2 diabetes mellitus with diabetic neuropathy, unspecified: Secondary | ICD-10-CM | POA: Diagnosis not present

## 2021-06-06 ENCOUNTER — Encounter (INDEPENDENT_AMBULATORY_CARE_PROVIDER_SITE_OTHER): Payer: Federal, State, Local not specified - PPO | Admitting: Ophthalmology

## 2021-06-06 ENCOUNTER — Encounter (INDEPENDENT_AMBULATORY_CARE_PROVIDER_SITE_OTHER): Payer: Self-pay

## 2021-06-30 DIAGNOSIS — D72819 Decreased white blood cell count, unspecified: Secondary | ICD-10-CM | POA: Diagnosis not present

## 2021-07-03 ENCOUNTER — Telehealth: Payer: Self-pay | Admitting: Hematology and Oncology

## 2021-07-03 NOTE — Telephone Encounter (Signed)
Scheduled appt per 3/23 referral. Pt is aware of appt date and time. Pt is aware to arrive 15 mins prior to appt time and to bring and updated insurance card. Pt is aware of appt location.   ?

## 2021-07-14 DIAGNOSIS — E1169 Type 2 diabetes mellitus with other specified complication: Secondary | ICD-10-CM | POA: Diagnosis not present

## 2021-07-17 ENCOUNTER — Inpatient Hospital Stay: Payer: Federal, State, Local not specified - PPO

## 2021-07-17 ENCOUNTER — Encounter: Payer: Self-pay | Admitting: Hematology and Oncology

## 2021-07-17 ENCOUNTER — Other Ambulatory Visit: Payer: Self-pay

## 2021-07-17 ENCOUNTER — Inpatient Hospital Stay
Payer: Federal, State, Local not specified - PPO | Attending: Hematology and Oncology | Admitting: Hematology and Oncology

## 2021-07-17 VITALS — BP 142/77 | HR 76 | Temp 97.9°F | Resp 18 | Ht 73.0 in | Wt 206.6 lb

## 2021-07-17 DIAGNOSIS — D72819 Decreased white blood cell count, unspecified: Secondary | ICD-10-CM | POA: Insufficient documentation

## 2021-07-17 DIAGNOSIS — E119 Type 2 diabetes mellitus without complications: Secondary | ICD-10-CM | POA: Diagnosis not present

## 2021-07-17 DIAGNOSIS — F17210 Nicotine dependence, cigarettes, uncomplicated: Secondary | ICD-10-CM | POA: Insufficient documentation

## 2021-07-17 DIAGNOSIS — E559 Vitamin D deficiency, unspecified: Secondary | ICD-10-CM | POA: Insufficient documentation

## 2021-07-17 DIAGNOSIS — Z794 Long term (current) use of insulin: Secondary | ICD-10-CM | POA: Insufficient documentation

## 2021-07-17 DIAGNOSIS — E538 Deficiency of other specified B group vitamins: Secondary | ICD-10-CM | POA: Diagnosis not present

## 2021-07-17 LAB — CBC WITH DIFFERENTIAL (CANCER CENTER ONLY)
Abs Immature Granulocytes: 0 10*3/uL (ref 0.00–0.07)
Basophils Absolute: 0 10*3/uL (ref 0.0–0.1)
Basophils Relative: 1 %
Eosinophils Absolute: 0.1 10*3/uL (ref 0.0–0.5)
Eosinophils Relative: 4 %
HCT: 41.2 % (ref 39.0–52.0)
Hemoglobin: 13.6 g/dL (ref 13.0–17.0)
Immature Granulocytes: 0 %
Lymphocytes Relative: 43 %
Lymphs Abs: 1.4 10*3/uL (ref 0.7–4.0)
MCH: 27.5 pg (ref 26.0–34.0)
MCHC: 33 g/dL (ref 30.0–36.0)
MCV: 83.4 fL (ref 80.0–100.0)
Monocytes Absolute: 0.3 10*3/uL (ref 0.1–1.0)
Monocytes Relative: 10 %
Neutro Abs: 1.3 10*3/uL — ABNORMAL LOW (ref 1.7–7.7)
Neutrophils Relative %: 42 %
Platelet Count: 232 10*3/uL (ref 150–400)
RBC: 4.94 MIL/uL (ref 4.22–5.81)
RDW: 13.3 % (ref 11.5–15.5)
WBC Count: 3.1 10*3/uL — ABNORMAL LOW (ref 4.0–10.5)
nRBC: 0 % (ref 0.0–0.2)

## 2021-07-17 LAB — VITAMIN B12: Vitamin B-12: 191 pg/mL (ref 180–914)

## 2021-07-17 LAB — VITAMIN D 25 HYDROXY (VIT D DEFICIENCY, FRACTURES): Vit D, 25-Hydroxy: 40.2 ng/mL (ref 30–100)

## 2021-07-17 NOTE — Assessment & Plan Note (Signed)
I suspect the cause of his leukopenia is related to borderline B12 deficiency ?He does not need extensive work-up ?I will order repeat CBC, B12 level and others ?Recommend the patient to resume oral vitamin B12 supplement ?I recommend recheck his blood count again in 6 months for further follow-up ?

## 2021-07-17 NOTE — Assessment & Plan Note (Signed)
He is at risk of vitamin D deficiency ?I recommend vitamin D supplement ?I will check vitamin B12 level and we will call him with test results ?

## 2021-07-17 NOTE — Assessment & Plan Note (Signed)
The patient has consumed a vegan diet for many years ?He has stopped vitamin B12 supplementation recently ?I recommend resumption of high-dose oral vitamin B12 ?

## 2021-07-17 NOTE — Progress Notes (Signed)
[place] ?CONSULT NOTE ? ?Patient Care Team: ?Seward Carol, MD as PCP - General (Internal Medicine) ? ?CHIEF COMPLAINTS/PURPOSE OF CONSULTATION:  ?Chronic leukopenia ? ?HISTORY OF PRESENTING ILLNESS:  ?Kevin Richardson 51 y.o. male is here because of recent findings of chronic leukopenia ? ?He was found to have abnormal CBC from recent blood draw with his primary care doctor ?I have had opportunity to review his CBC from 2020 ?His white blood cell count is normal ?He had recent follow-up and annual visit with his primary care doctor ?On review of the scan report, his CBC was abnormal as follows ? ?May 01, 2021, total white blood cell count 2.2 ?May 12, 2021, white blood cell count 3.0 ?June 30, 2021, white blood cell count 2.4 ? ?He denies recent infection. The last prescription antibiotics was more than 3 months ago ?There is not reported symptoms of sinus congestion, cough, urinary frequency/urgency or dysuria, diarrhea, joint swelling/pain or abnormal skin rash.  ?He does have chronic allergies/sinusitis on the annual basis ?He had no prior history or diagnosis of cancer. His age appropriate screening programs are up-to-date. ?The patient has no prior diagnosis of autoimmune disease and was not prescribed corticosteroids related products. ?The patient was a smoker and has smoked cigars for many years, at least 16 but quit approximately a week and a half ago ?He rarely drinks alcohol ?The patient follows a strict vegan diet since 1996 ?On review of his blood work in the past, it was noted that the patient have insulin-dependent diabetes ?However, he started to follow strict intermittent fasting practice and has lost tremendous amount of weight and since then, was able to discontinue insulin, antihypertensive and cholesterol medicines ? ?MEDICAL HISTORY:  ?Past Medical History:  ?Diagnosis Date  ? Diabetes mellitus without complication ([place])   ? Eczema   ? Hypertension   ? ? ?SURGICAL  HISTORY: ?Past Surgical History:  ?Procedure Laterality Date  ? ARTERY AND TENDON REPAIR Left 09/11/2018  ? Procedure: ARTERY, TENDON, NERVE REPAIR AS NECESSARY;  Surgeon: Roseanne Kaufman, MD;  Location: [facility];  Service: Orthopedics;  Laterality: Left;  ? GALLBLADDER SURGERY    ? I & D EXTREMITY Left 09/11/2018  ? Procedure: IRRIGATION AND DEBRIDEMENT LEFT HAND;  Surgeon: Roseanne Kaufman, MD;  Location: [facility];  Service: Orthopedics;  Laterality: Left;  ? ? ?SOCIAL HISTORY: ?Social History  ? ?Socioeconomic History  ? Marital status: Married  ?  Spouse name: Not on file  ? Number of children: 2  ? Years of education: Not on file  ? Highest education level: Not on file  ?Occupational History  ? Not on file  ?Tobacco Use  ? Smoking status: Former  ?  Types: Cigarettes  ? Smokeless tobacco: Never  ?Vaping Use  ? Vaping Use: Never used  ?Substance and Sexual Activity  ? Alcohol use: Never  ? Drug use: Never  ? Sexual activity: Not on file  ?Other Topics Concern  ? Not on file  ?Social History Narrative  ? Not on file  ? ?Social Determinants of Health  ? ?Financial Resource Strain: Not on file  ?Food Insecurity: Not on file  ?Transportation Needs: Not on file  ?Physical Activity: Not on file  ?Stress: Not on file  ?Social Connections: Not on file  ?Intimate Partner Violence: Not on file  ? ? ?FAMILY HISTORY: ?Family History  ?Problem Relation Age of Onset  ? Asthma Mother   ? Allergic rhinitis Mother   ? ? ?ALLERGIES:  has  No Known Allergies. ? ?MEDICATIONS:  ?Current Outpatient Medications  ?Medication Sig Dispense Refill  ? cetirizine (ZYRTEC) 10 MG tablet Take 10 mg by mouth daily.    ? cholecalciferol (VITAMIN D3) 25 MCG (1000 UNIT) tablet Take 1,000 Units by mouth daily.    ? Cyanocobalamin (VITAMIN B12 PO) Take 1 tablet by mouth daily.    ? MAGNESIUM CHLORIDE PO Take 1 tablet by mouth daily.    ? omega-3 acid ethyl esters (LOVAZA) 1 g capsule Take 1 g by mouth daily.    ? triamcinolone (NASACORT) 55 MCG/ACT AERO nasal  inhaler Place 2 sprays into the nose daily.    ? ?No current facility-administered medications for this visit.  ? ? ?REVIEW OF SYSTEMS:   ?Constitutional: Denies fevers, chills or abnormal night sweats ?Eyes: Denies blurriness of vision, double vision or watery eyes ?Ears, nose, mouth, throat, and face: Denies mucositis or sore throat ?Respiratory: Denies cough, dyspnea or wheezes ?Cardiovascular: Denies palpitation, chest discomfort or lower extremity swelling ?Gastrointestinal:  Denies nausea, heartburn or change in bowel habits ?Skin: Denies abnormal skin rashes ?Lymphatics: Denies new lymphadenopathy or easy bruising ?Neurological:Denies numbness, tingling or new weaknesses ?Behavioral/Psych: Mood is stable, no new changes  ?All other systems were reviewed with the patient and are negative. ? ?PHYSICAL EXAMINATION: ?ECOG PERFORMANCE STATUS: 0 - Asymptomatic ? ?Vitals:  ? 07/17/21 1418  ?BP: (!) 142/77  ?Pulse: 76  ?Resp: 18  ?Temp: 97.9 ?F (36.6 ?C)  ?SpO2: 100%  ? ?Filed Weights  ? 07/17/21 1418  ?Weight: 206 lb 9.6 oz (93.7 kg)  ? ? ?GENERAL:alert, no distress and comfortable ?SKIN: skin color, texture, turgor are normal, no rashes or significant lesions ?EYES: normal, conjunctiva are pink and non-injected, sclera clear ?OROPHARYNX:no exudate, no erythema and lips, buccal mucosa, and tongue normal  ?NECK: supple, thyroid normal size, non-tender, without nodularity ?LYMPH:  no palpable lymphadenopathy in the cervical, axillary or inguinal ?LUNGS: clear to auscultation and percussion with normal breathing effort ?HEART: regular rate & rhythm and no murmurs and no lower extremity edema ?ABDOMEN:abdomen soft, non-tender and normal bowel sounds ?Musculoskeletal:no cyanosis of digits and no clubbing  ?PSYCH: alert & oriented x 3 with fluent speech ?NEURO: no focal motor/sensory deficits ? ?LABORATORY DATA:  ?I have reviewed the data as listed ?Recent Results (from the past 2160 hour(s))  ?CBC with Differential  (Cancer Center Only)     Status: Abnormal  ? Collection Time: 07/17/21  2:58 PM  ?Result Value Ref Range  ? WBC Count 3.1 (L) 4.0 - 10.5 K/uL  ? RBC 4.94 4.22 - 5.81 MIL/uL  ? Hemoglobin 13.6 13.0 - 17.0 g/dL  ? HCT 41.2 39.0 - 52.0 %  ? MCV 83.4 80.0 - 100.0 fL  ? MCH 27.5 26.0 - 34.0 pg  ? MCHC 33.0 30.0 - 36.0 g/dL  ? RDW 13.3 11.5 - 15.5 %  ? Platelet Count 232 150 - 400 K/uL  ? nRBC 0.0 0.0 - 0.2 %  ? Neutrophils Relative % 42 %  ? Neutro Abs 1.3 (L) 1.7 - 7.7 K/uL  ? Lymphocytes Relative 43 %  ? Lymphs Abs 1.4 0.7 - 4.0 K/uL  ? Monocytes Relative 10 %  ? Monocytes Absolute 0.3 0.1 - 1.0 K/uL  ? Eosinophils Relative 4 %  ? Eosinophils Absolute 0.1 0.0 - 0.5 K/uL  ? Basophils Relative 1 %  ? Basophils Absolute 0.0 0.0 - 0.1 K/uL  ? Immature Granulocytes 0 %  ? Abs Immature Granulocytes 0.00 0.00 -  0.07 K/uL  ?  Comment: Performed at [facility], [place] [address], [place], [place] [address]  ?Vitamin B12     Status: None  ? Collection Time: 07/17/21  2:58 PM  ?Result Value Ref Range  ? Vitamin B-12 191 180 - 914 pg/mL  ?  Comment: (NOTE) ?This assay is not validated for testing neonatal or ?myeloproliferative syndrome specimens for Vitamin B12 levels. ?Performed at [facility], [address] ?[place], [place] [address] ?  ? ? ?RADIOGRAPHIC STUDIES: His prior imaging study with MRI showed no evidence of splenomegaly ?I have personally reviewed the radiological images as listed and agreed with the findings in the report. ?ASSESSMENT & PLAN ? ?Chronic leukopenia ?I suspect the cause of his leukopenia is related to borderline B12 deficiency ?He does not need extensive work-up ?I will order repeat CBC, B12 level and others ?Recommend the patient to resume oral vitamin B12 supplement ?I recommend recheck his blood count again in 6 months for further follow-up ? ?Vitamin B12 deficiency ?The patient has consumed a vegan diet for many years ?He has stopped vitamin B12  supplementation recently ?I recommend resumption of high-dose oral vitamin B12 ? ?Vitamin D deficiency ?He is at risk of vitamin D deficiency ?I recommend vitamin D supplement ?I will check vitamin B12 level and we wil

## 2021-07-18 ENCOUNTER — Telehealth: Payer: Self-pay | Admitting: Hematology and Oncology

## 2021-07-18 NOTE — Telephone Encounter (Signed)
Review test results with the patient ?He has persistent leukopenia which I suspect could be due to borderline vitamin B12 deficiency ?I recommend resumption of vitamin B12 supplement at 1 mg daily ?I do not believe he needs intramuscular injection ?I also recommend low-dose vitamin D supplement ?He has appointment to see his primary care doctor in 3 months ?I recommend he gets his CBC and B12 level repeated with his primary care doctor ?He does not need long-term follow-up with me ?I have addressed all his questions ?

## 2021-10-29 DIAGNOSIS — M7660 Achilles tendinitis, unspecified leg: Secondary | ICD-10-CM | POA: Diagnosis not present

## 2021-10-29 DIAGNOSIS — E7800 Pure hypercholesterolemia, unspecified: Secondary | ICD-10-CM | POA: Diagnosis not present

## 2021-10-29 DIAGNOSIS — E1140 Type 2 diabetes mellitus with diabetic neuropathy, unspecified: Secondary | ICD-10-CM | POA: Diagnosis not present

## 2021-10-29 DIAGNOSIS — D72819 Decreased white blood cell count, unspecified: Secondary | ICD-10-CM | POA: Diagnosis not present

## 2021-10-31 DIAGNOSIS — D72819 Decreased white blood cell count, unspecified: Secondary | ICD-10-CM | POA: Diagnosis not present

## 2022-01-06 IMAGING — MR MR ABDOMEN WO/W CM
11 of 17 series · 28 of 48 positions shown · IV contrast (multihance)
Comparison: MRI on 05/15/2019, and CT on 04/12/2019

CLINICAL DATA: Follow-up indeterminate liver lesion.

EXAM:
MRI ABDOMEN WITHOUT AND WITH CONTRAST
TECHNIQUE: Multiplanar multisequence MR imaging of the abdomen was performed
both before and after the administration of intravenous contrast.
CONTRAST:  20mL MULTIHANCE GADOBENATE DIMEGLUMINE 529 MG/ML IV SOLN

[Series 3: cor haste · coronal · 5.0mm · 0.74mm/px · 2 of 36 slices shown]
[im 1/36]
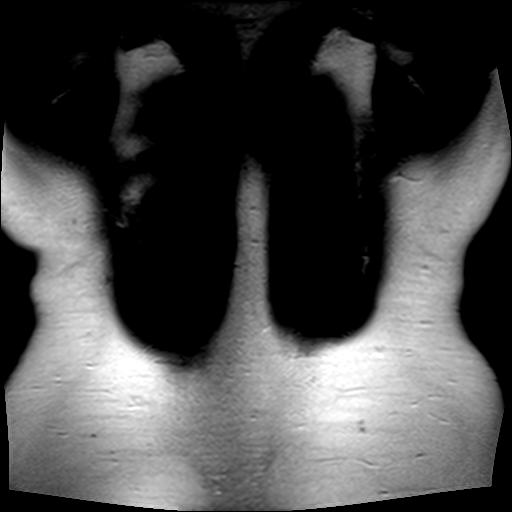
[im 36/36]
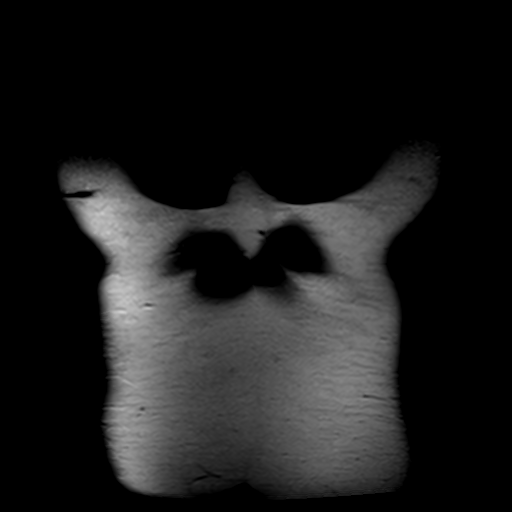

[Series 4: T1 · axial · 6.0mm · 0.74mm/px · z∈[-79,+158]mm · 4 of 74 slices shown]
[im 1/74]
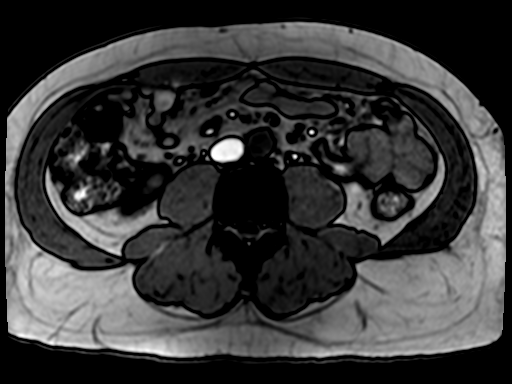
[im 25/74]
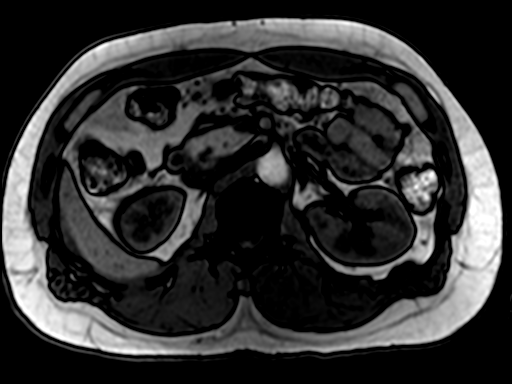
[im 49/74]
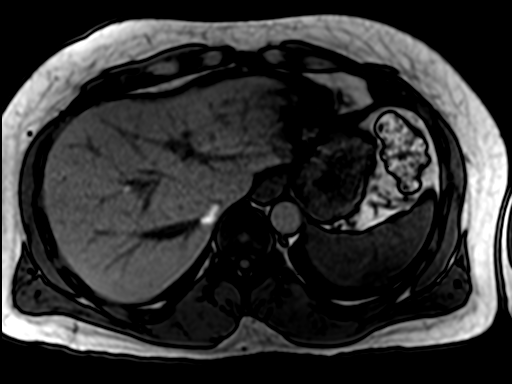
[im 74/74]
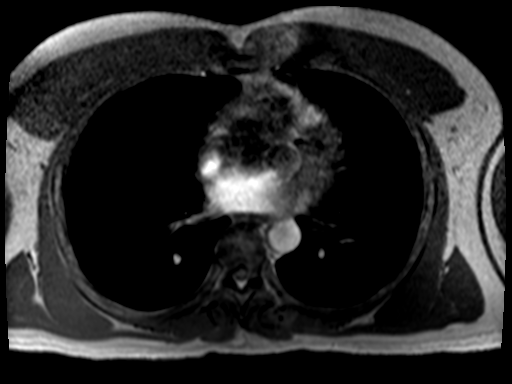

[Series 5: axial haste · axial · 6.0mm · 0.74mm/px · z∈[-75,+169]mm · 2 of 38 slices shown]
[im 1/38]
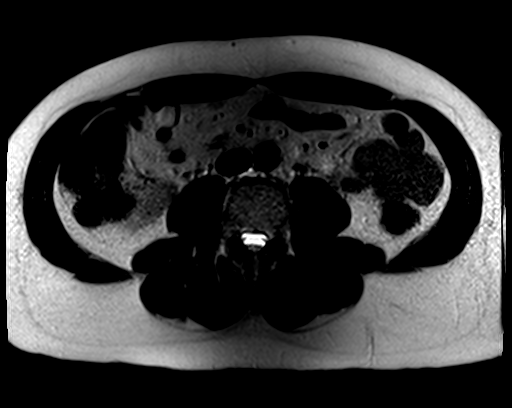
[im 38/38]
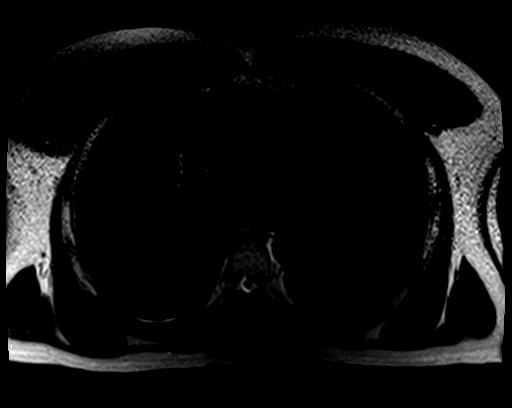

[Series 6: T2 · axial · 6.0mm · 1.12mm/px · z∈[-55,+190]mm · 2 of 35 slices shown]
[im 1/35]
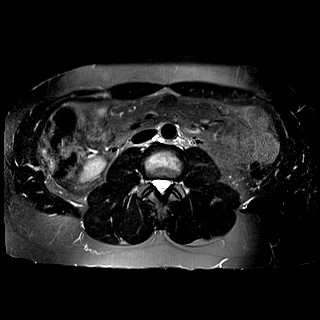
[im 35/35]
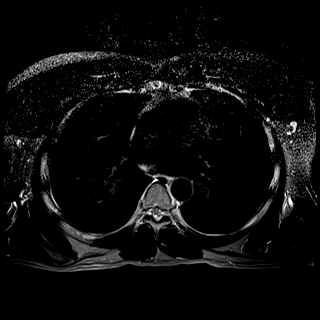

[Series 7: ep2d_[id number]_500_800_p2_trig · axial · 6.0mm · 1.98mm/px · z∈[-55,+190]mm · 5 of 105 slices shown]
[im 1/105]
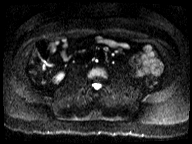
[im 27/105]
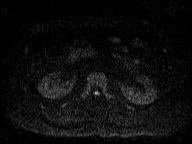
[im 53/105]
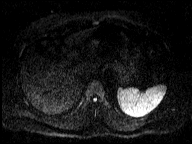
[im 79/105]
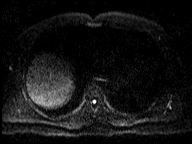
[im 105/105]
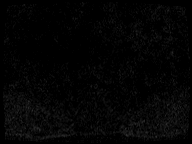

[Series 8: ep2d_[id number]_500_800_p2_trig_adc · axial · 6.0mm · 1.98mm/px · 1 of 35 slices shown]
[im 1/35]
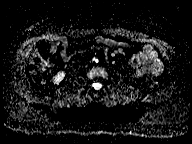

[Series 9: bSSFP · axial · 4.0mm · 0.74mm/px · z∈[-2,+178]mm · 2 of 46 slices shown]
[im 1/46]
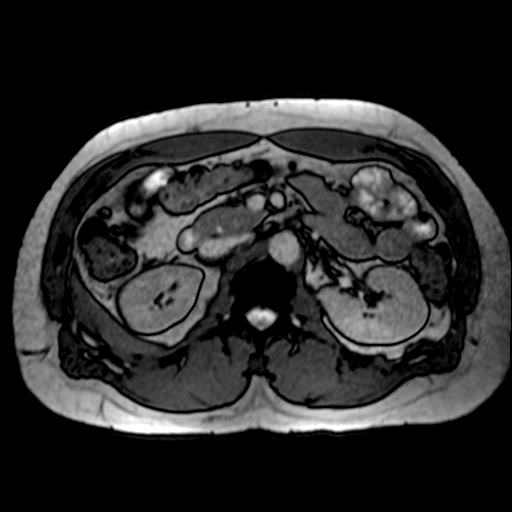
[im 46/46]
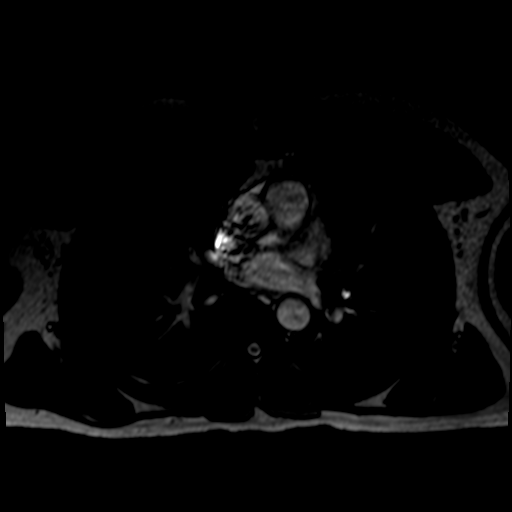

[Series 10: T1 dynamic · axial · non-contrast · 2.5mm · 0.74mm/px · z∈[-60,+178]mm · 3 of 96 slices shown]
[im 1/96]
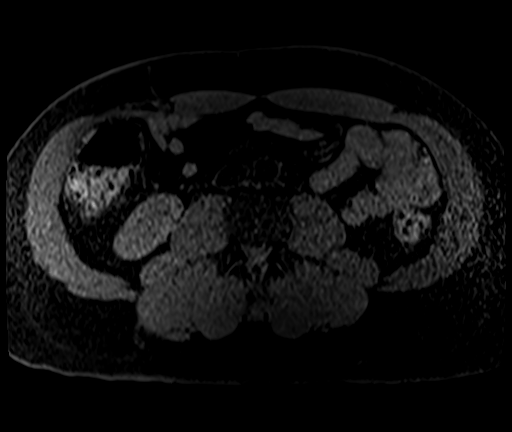
[im 48/96]
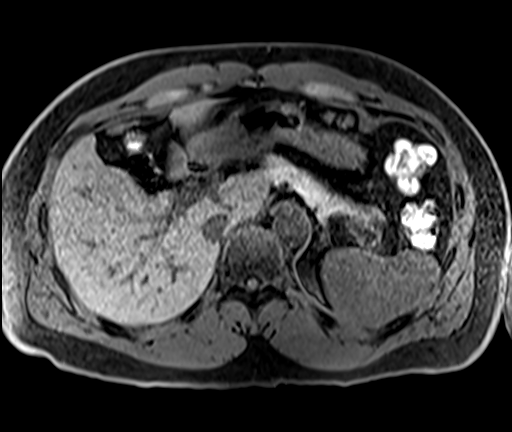
[im 96/96]
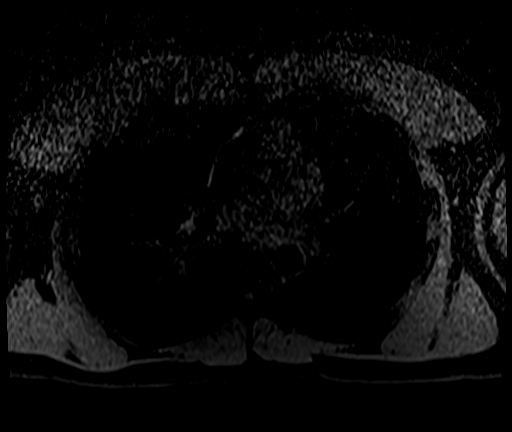

[Series 11: T1 dynamic post-contrast · axial · 2.5mm · 0.74mm/px · z∈[-60,+178]mm · 3 of 96 slices shown (1 of 3)]
[im 1/96]
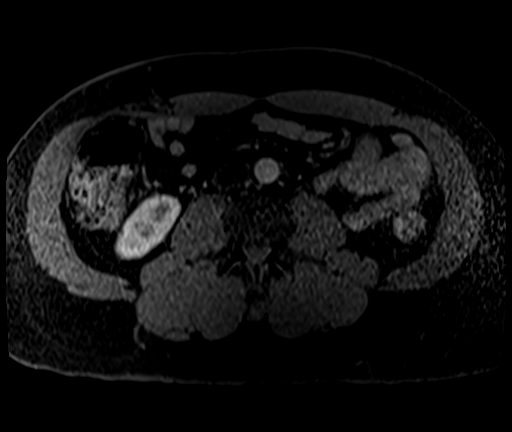
[im 48/96]
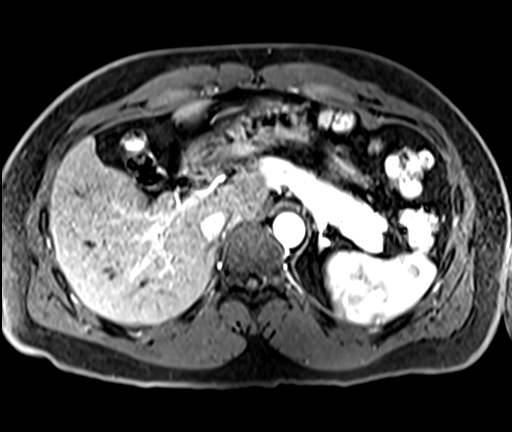
[im 96/96]
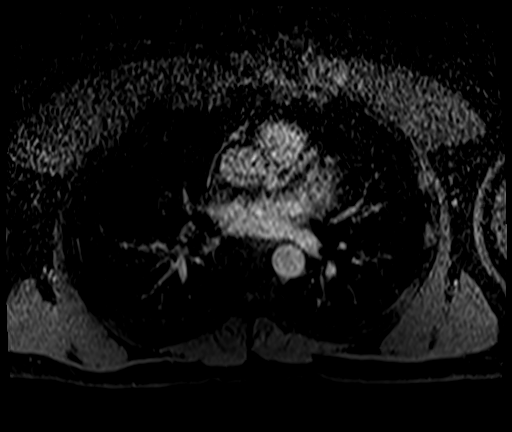

[Series 12: T1 dynamic post-contrast · axial · 2.5mm · 0.74mm/px · z∈[-60,+178]mm · 3 of 96 slices shown (2 of 3)]
[im 1/96]
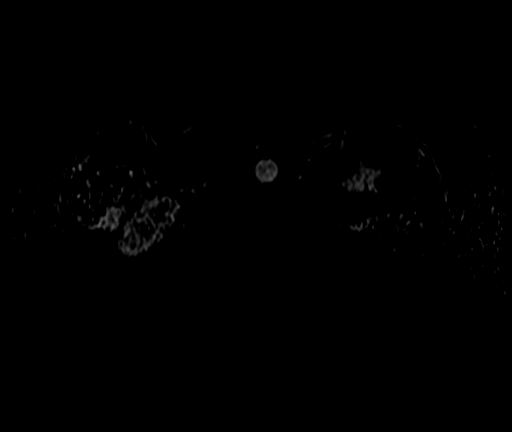
[im 48/96]
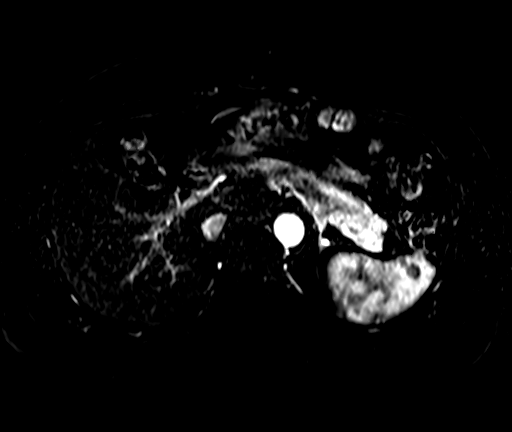
[im 96/96]
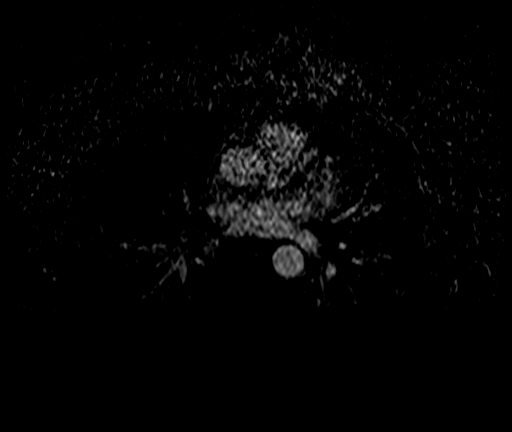

[Series 13: T1 dynamic post-contrast · axial · 2.5mm · 0.74mm/px · 1 of 96 slices shown (3 of 3)]
[im 1/96]
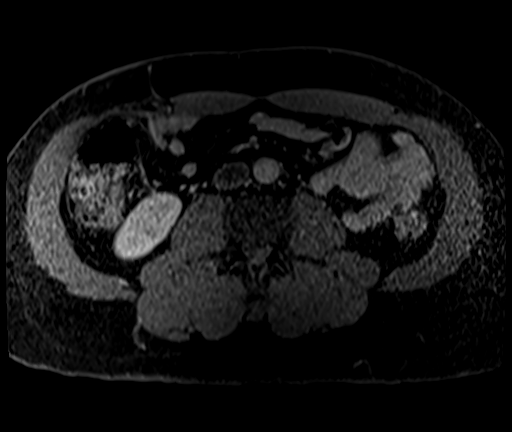

[28 of 48 positions shown; findings below may reference images not displayed]

FINDINGS: Lower chest: No acute findings.

Hepatobiliary: A 14 mm lesion is seen anterior right hepatic lobe
which shows marked T2 hyperintensity and mild peripheral nodular
enhancement, but no progressive central nodular enhancement. This is
stable since previous studies, suggesting a benign etiology such as
an atypical hemangioma. No other liver lesions are identified. Prior
cholecystectomy. No evidence of biliary obstruction.

Pancreas:  No mass or inflammatory changes.

Spleen:  Within normal limits in size and appearance.

Adrenals/Urinary Tract: No masses identified. No evidence of
hydronephrosis.

Stomach/Bowel: Visualized portion unremarkable.

Vascular/Lymphatic: No pathologically enlarged lymph nodes
identified. No abdominal aortic aneurysm.

Other:  None.

Musculoskeletal:  No suspicious bone lesions identified.
IMPRESSION: Stable small right hepatic lobe lesion, likely representing a benign
etiology such as an atypical hemangioma or cyst. Recommend continued
follow-up by MRI in 12 months.

## 2022-03-09 DIAGNOSIS — Z20822 Contact with and (suspected) exposure to covid-19: Secondary | ICD-10-CM | POA: Diagnosis not present

## 2022-03-09 DIAGNOSIS — J029 Acute pharyngitis, unspecified: Secondary | ICD-10-CM | POA: Diagnosis not present

## 2022-03-09 DIAGNOSIS — J069 Acute upper respiratory infection, unspecified: Secondary | ICD-10-CM | POA: Diagnosis not present

## 2022-03-09 DIAGNOSIS — R0981 Nasal congestion: Secondary | ICD-10-CM | POA: Diagnosis not present

## 2022-11-09 DIAGNOSIS — Z6827 Body mass index (BMI) 27.0-27.9, adult: Secondary | ICD-10-CM | POA: Diagnosis not present

## 2022-11-09 DIAGNOSIS — R030 Elevated blood-pressure reading, without diagnosis of hypertension: Secondary | ICD-10-CM | POA: Diagnosis not present

## 2022-11-09 DIAGNOSIS — U071 COVID-19: Secondary | ICD-10-CM | POA: Diagnosis not present

## 2023-03-10 DIAGNOSIS — I10 Essential (primary) hypertension: Secondary | ICD-10-CM | POA: Diagnosis not present

## 2023-03-10 DIAGNOSIS — E782 Mixed hyperlipidemia: Secondary | ICD-10-CM | POA: Diagnosis not present

## 2023-03-10 DIAGNOSIS — E1169 Type 2 diabetes mellitus with other specified complication: Secondary | ICD-10-CM | POA: Diagnosis not present

## 2023-03-18 DIAGNOSIS — I10 Essential (primary) hypertension: Secondary | ICD-10-CM | POA: Diagnosis not present

## 2023-03-18 DIAGNOSIS — E1169 Type 2 diabetes mellitus with other specified complication: Secondary | ICD-10-CM | POA: Diagnosis not present

## 2023-03-18 DIAGNOSIS — Z0000 Encounter for general adult medical examination without abnormal findings: Secondary | ICD-10-CM | POA: Diagnosis not present

## 2023-03-18 DIAGNOSIS — E782 Mixed hyperlipidemia: Secondary | ICD-10-CM | POA: Diagnosis not present

## 2023-03-18 DIAGNOSIS — Z125 Encounter for screening for malignant neoplasm of prostate: Secondary | ICD-10-CM | POA: Diagnosis not present

## 2023-03-25 DIAGNOSIS — E7841 Elevated Lipoprotein(a): Secondary | ICD-10-CM | POA: Diagnosis not present

## 2023-03-25 DIAGNOSIS — E1169 Type 2 diabetes mellitus with other specified complication: Secondary | ICD-10-CM | POA: Diagnosis not present

## 2023-03-25 DIAGNOSIS — Z0000 Encounter for general adult medical examination without abnormal findings: Secondary | ICD-10-CM | POA: Diagnosis not present

## 2023-03-25 DIAGNOSIS — E782 Mixed hyperlipidemia: Secondary | ICD-10-CM | POA: Diagnosis not present

## 2023-03-25 DIAGNOSIS — I10 Essential (primary) hypertension: Secondary | ICD-10-CM | POA: Diagnosis not present

## 2023-06-03 DIAGNOSIS — I10 Essential (primary) hypertension: Secondary | ICD-10-CM | POA: Diagnosis not present

## 2023-06-03 DIAGNOSIS — E1169 Type 2 diabetes mellitus with other specified complication: Secondary | ICD-10-CM | POA: Diagnosis not present

## 2023-06-03 DIAGNOSIS — E7841 Elevated Lipoprotein(a): Secondary | ICD-10-CM | POA: Diagnosis not present

## 2023-06-03 DIAGNOSIS — E782 Mixed hyperlipidemia: Secondary | ICD-10-CM | POA: Diagnosis not present

## 2023-06-10 DIAGNOSIS — I10 Essential (primary) hypertension: Secondary | ICD-10-CM | POA: Diagnosis not present

## 2023-06-10 DIAGNOSIS — E1169 Type 2 diabetes mellitus with other specified complication: Secondary | ICD-10-CM | POA: Diagnosis not present

## 2023-06-10 DIAGNOSIS — E7841 Elevated Lipoprotein(a): Secondary | ICD-10-CM | POA: Diagnosis not present

## 2023-06-10 DIAGNOSIS — E782 Mixed hyperlipidemia: Secondary | ICD-10-CM | POA: Diagnosis not present

## 2023-07-02 DIAGNOSIS — J0140 Acute pansinusitis, unspecified: Secondary | ICD-10-CM | POA: Diagnosis not present

## 2023-08-05 DIAGNOSIS — M9902 Segmental and somatic dysfunction of thoracic region: Secondary | ICD-10-CM | POA: Diagnosis not present

## 2023-08-05 DIAGNOSIS — M25512 Pain in left shoulder: Secondary | ICD-10-CM | POA: Diagnosis not present

## 2023-08-05 DIAGNOSIS — M9907 Segmental and somatic dysfunction of upper extremity: Secondary | ICD-10-CM | POA: Diagnosis not present

## 2023-08-05 DIAGNOSIS — M5442 Lumbago with sciatica, left side: Secondary | ICD-10-CM | POA: Diagnosis not present

## 2023-08-09 DIAGNOSIS — M9905 Segmental and somatic dysfunction of pelvic region: Secondary | ICD-10-CM | POA: Diagnosis not present

## 2023-08-09 DIAGNOSIS — M9902 Segmental and somatic dysfunction of thoracic region: Secondary | ICD-10-CM | POA: Diagnosis not present

## 2023-08-09 DIAGNOSIS — M9903 Segmental and somatic dysfunction of lumbar region: Secondary | ICD-10-CM | POA: Diagnosis not present

## 2023-08-09 DIAGNOSIS — M9904 Segmental and somatic dysfunction of sacral region: Secondary | ICD-10-CM | POA: Diagnosis not present

## 2023-08-09 DIAGNOSIS — M40292 Other kyphosis, cervical region: Secondary | ICD-10-CM | POA: Diagnosis not present

## 2023-08-11 DIAGNOSIS — M9902 Segmental and somatic dysfunction of thoracic region: Secondary | ICD-10-CM | POA: Diagnosis not present

## 2023-08-11 DIAGNOSIS — M9903 Segmental and somatic dysfunction of lumbar region: Secondary | ICD-10-CM | POA: Diagnosis not present

## 2023-08-11 DIAGNOSIS — M9905 Segmental and somatic dysfunction of pelvic region: Secondary | ICD-10-CM | POA: Diagnosis not present

## 2023-08-11 DIAGNOSIS — M9901 Segmental and somatic dysfunction of cervical region: Secondary | ICD-10-CM | POA: Diagnosis not present

## 2023-08-16 DIAGNOSIS — M9902 Segmental and somatic dysfunction of thoracic region: Secondary | ICD-10-CM | POA: Diagnosis not present

## 2023-08-16 DIAGNOSIS — M9903 Segmental and somatic dysfunction of lumbar region: Secondary | ICD-10-CM | POA: Diagnosis not present

## 2023-08-16 DIAGNOSIS — M9901 Segmental and somatic dysfunction of cervical region: Secondary | ICD-10-CM | POA: Diagnosis not present

## 2023-08-16 DIAGNOSIS — M9905 Segmental and somatic dysfunction of pelvic region: Secondary | ICD-10-CM | POA: Diagnosis not present

## 2023-08-19 DIAGNOSIS — M9902 Segmental and somatic dysfunction of thoracic region: Secondary | ICD-10-CM | POA: Diagnosis not present

## 2023-08-19 DIAGNOSIS — M9901 Segmental and somatic dysfunction of cervical region: Secondary | ICD-10-CM | POA: Diagnosis not present

## 2023-08-19 DIAGNOSIS — M9903 Segmental and somatic dysfunction of lumbar region: Secondary | ICD-10-CM | POA: Diagnosis not present

## 2023-08-19 DIAGNOSIS — M9905 Segmental and somatic dysfunction of pelvic region: Secondary | ICD-10-CM | POA: Diagnosis not present

## 2023-08-24 DIAGNOSIS — M9902 Segmental and somatic dysfunction of thoracic region: Secondary | ICD-10-CM | POA: Diagnosis not present

## 2023-08-24 DIAGNOSIS — M9905 Segmental and somatic dysfunction of pelvic region: Secondary | ICD-10-CM | POA: Diagnosis not present

## 2023-08-24 DIAGNOSIS — M9903 Segmental and somatic dysfunction of lumbar region: Secondary | ICD-10-CM | POA: Diagnosis not present

## 2023-08-24 DIAGNOSIS — M9901 Segmental and somatic dysfunction of cervical region: Secondary | ICD-10-CM | POA: Diagnosis not present

## 2023-08-26 DIAGNOSIS — M9905 Segmental and somatic dysfunction of pelvic region: Secondary | ICD-10-CM | POA: Diagnosis not present

## 2023-08-26 DIAGNOSIS — M9903 Segmental and somatic dysfunction of lumbar region: Secondary | ICD-10-CM | POA: Diagnosis not present

## 2023-08-26 DIAGNOSIS — M9902 Segmental and somatic dysfunction of thoracic region: Secondary | ICD-10-CM | POA: Diagnosis not present

## 2023-08-26 DIAGNOSIS — M9901 Segmental and somatic dysfunction of cervical region: Secondary | ICD-10-CM | POA: Diagnosis not present

## 2023-08-31 DIAGNOSIS — M9903 Segmental and somatic dysfunction of lumbar region: Secondary | ICD-10-CM | POA: Diagnosis not present

## 2023-08-31 DIAGNOSIS — M9902 Segmental and somatic dysfunction of thoracic region: Secondary | ICD-10-CM | POA: Diagnosis not present

## 2023-08-31 DIAGNOSIS — M9901 Segmental and somatic dysfunction of cervical region: Secondary | ICD-10-CM | POA: Diagnosis not present

## 2023-08-31 DIAGNOSIS — M9905 Segmental and somatic dysfunction of pelvic region: Secondary | ICD-10-CM | POA: Diagnosis not present

## 2023-09-02 DIAGNOSIS — M9905 Segmental and somatic dysfunction of pelvic region: Secondary | ICD-10-CM | POA: Diagnosis not present

## 2023-09-02 DIAGNOSIS — M9901 Segmental and somatic dysfunction of cervical region: Secondary | ICD-10-CM | POA: Diagnosis not present

## 2023-09-02 DIAGNOSIS — M9903 Segmental and somatic dysfunction of lumbar region: Secondary | ICD-10-CM | POA: Diagnosis not present

## 2023-09-02 DIAGNOSIS — M9902 Segmental and somatic dysfunction of thoracic region: Secondary | ICD-10-CM | POA: Diagnosis not present

## 2023-09-08 DIAGNOSIS — M9905 Segmental and somatic dysfunction of pelvic region: Secondary | ICD-10-CM | POA: Diagnosis not present

## 2023-09-08 DIAGNOSIS — M9901 Segmental and somatic dysfunction of cervical region: Secondary | ICD-10-CM | POA: Diagnosis not present

## 2023-09-08 DIAGNOSIS — M9903 Segmental and somatic dysfunction of lumbar region: Secondary | ICD-10-CM | POA: Diagnosis not present

## 2023-09-08 DIAGNOSIS — M9902 Segmental and somatic dysfunction of thoracic region: Secondary | ICD-10-CM | POA: Diagnosis not present

## 2023-09-09 DIAGNOSIS — M9902 Segmental and somatic dysfunction of thoracic region: Secondary | ICD-10-CM | POA: Diagnosis not present

## 2023-09-09 DIAGNOSIS — M9903 Segmental and somatic dysfunction of lumbar region: Secondary | ICD-10-CM | POA: Diagnosis not present

## 2023-09-09 DIAGNOSIS — M9905 Segmental and somatic dysfunction of pelvic region: Secondary | ICD-10-CM | POA: Diagnosis not present

## 2023-09-09 DIAGNOSIS — M9901 Segmental and somatic dysfunction of cervical region: Secondary | ICD-10-CM | POA: Diagnosis not present

## 2023-09-15 DIAGNOSIS — M9903 Segmental and somatic dysfunction of lumbar region: Secondary | ICD-10-CM | POA: Diagnosis not present

## 2023-09-15 DIAGNOSIS — M9907 Segmental and somatic dysfunction of upper extremity: Secondary | ICD-10-CM | POA: Diagnosis not present

## 2023-09-15 DIAGNOSIS — M25512 Pain in left shoulder: Secondary | ICD-10-CM | POA: Diagnosis not present

## 2023-09-21 DIAGNOSIS — M9903 Segmental and somatic dysfunction of lumbar region: Secondary | ICD-10-CM | POA: Diagnosis not present

## 2023-09-21 DIAGNOSIS — M9901 Segmental and somatic dysfunction of cervical region: Secondary | ICD-10-CM | POA: Diagnosis not present

## 2023-09-21 DIAGNOSIS — M9905 Segmental and somatic dysfunction of pelvic region: Secondary | ICD-10-CM | POA: Diagnosis not present

## 2023-09-21 DIAGNOSIS — M9902 Segmental and somatic dysfunction of thoracic region: Secondary | ICD-10-CM | POA: Diagnosis not present

## 2023-09-28 DIAGNOSIS — M9902 Segmental and somatic dysfunction of thoracic region: Secondary | ICD-10-CM | POA: Diagnosis not present

## 2023-09-28 DIAGNOSIS — M9901 Segmental and somatic dysfunction of cervical region: Secondary | ICD-10-CM | POA: Diagnosis not present

## 2023-09-28 DIAGNOSIS — M9905 Segmental and somatic dysfunction of pelvic region: Secondary | ICD-10-CM | POA: Diagnosis not present

## 2023-09-28 DIAGNOSIS — M9903 Segmental and somatic dysfunction of lumbar region: Secondary | ICD-10-CM | POA: Diagnosis not present

## 2023-10-05 DIAGNOSIS — M9903 Segmental and somatic dysfunction of lumbar region: Secondary | ICD-10-CM | POA: Diagnosis not present

## 2023-10-05 DIAGNOSIS — M9905 Segmental and somatic dysfunction of pelvic region: Secondary | ICD-10-CM | POA: Diagnosis not present

## 2023-10-05 DIAGNOSIS — M9901 Segmental and somatic dysfunction of cervical region: Secondary | ICD-10-CM | POA: Diagnosis not present

## 2023-10-05 DIAGNOSIS — M9902 Segmental and somatic dysfunction of thoracic region: Secondary | ICD-10-CM | POA: Diagnosis not present

## 2023-10-14 DIAGNOSIS — M9902 Segmental and somatic dysfunction of thoracic region: Secondary | ICD-10-CM | POA: Diagnosis not present

## 2023-10-14 DIAGNOSIS — M9903 Segmental and somatic dysfunction of lumbar region: Secondary | ICD-10-CM | POA: Diagnosis not present

## 2023-10-14 DIAGNOSIS — M9901 Segmental and somatic dysfunction of cervical region: Secondary | ICD-10-CM | POA: Diagnosis not present

## 2023-10-14 DIAGNOSIS — M9905 Segmental and somatic dysfunction of pelvic region: Secondary | ICD-10-CM | POA: Diagnosis not present

## 2023-10-19 DIAGNOSIS — M9902 Segmental and somatic dysfunction of thoracic region: Secondary | ICD-10-CM | POA: Diagnosis not present

## 2023-10-19 DIAGNOSIS — M9905 Segmental and somatic dysfunction of pelvic region: Secondary | ICD-10-CM | POA: Diagnosis not present

## 2023-10-19 DIAGNOSIS — M9901 Segmental and somatic dysfunction of cervical region: Secondary | ICD-10-CM | POA: Diagnosis not present

## 2023-10-19 DIAGNOSIS — M9903 Segmental and somatic dysfunction of lumbar region: Secondary | ICD-10-CM | POA: Diagnosis not present

## 2023-10-27 DIAGNOSIS — M47892 Other spondylosis, cervical region: Secondary | ICD-10-CM | POA: Diagnosis not present

## 2023-10-27 DIAGNOSIS — M9903 Segmental and somatic dysfunction of lumbar region: Secondary | ICD-10-CM | POA: Diagnosis not present

## 2023-10-27 DIAGNOSIS — M5442 Lumbago with sciatica, left side: Secondary | ICD-10-CM | POA: Diagnosis not present

## 2023-10-28 DIAGNOSIS — E1169 Type 2 diabetes mellitus with other specified complication: Secondary | ICD-10-CM | POA: Diagnosis not present

## 2023-10-28 DIAGNOSIS — I10 Essential (primary) hypertension: Secondary | ICD-10-CM | POA: Diagnosis not present

## 2023-10-28 DIAGNOSIS — E7841 Elevated Lipoprotein(a): Secondary | ICD-10-CM | POA: Diagnosis not present

## 2023-10-28 DIAGNOSIS — E782 Mixed hyperlipidemia: Secondary | ICD-10-CM | POA: Diagnosis not present

## 2023-11-02 DIAGNOSIS — M9905 Segmental and somatic dysfunction of pelvic region: Secondary | ICD-10-CM | POA: Diagnosis not present

## 2023-11-02 DIAGNOSIS — M9901 Segmental and somatic dysfunction of cervical region: Secondary | ICD-10-CM | POA: Diagnosis not present

## 2023-11-02 DIAGNOSIS — M9902 Segmental and somatic dysfunction of thoracic region: Secondary | ICD-10-CM | POA: Diagnosis not present

## 2023-11-04 DIAGNOSIS — E1169 Type 2 diabetes mellitus with other specified complication: Secondary | ICD-10-CM | POA: Diagnosis not present

## 2023-11-04 DIAGNOSIS — I10 Essential (primary) hypertension: Secondary | ICD-10-CM | POA: Diagnosis not present

## 2023-11-04 DIAGNOSIS — E782 Mixed hyperlipidemia: Secondary | ICD-10-CM | POA: Diagnosis not present

## 2023-11-04 DIAGNOSIS — E7841 Elevated Lipoprotein(a): Secondary | ICD-10-CM | POA: Diagnosis not present

## 2023-11-09 DIAGNOSIS — E113393 Type 2 diabetes mellitus with moderate nonproliferative diabetic retinopathy without macular edema, bilateral: Secondary | ICD-10-CM | POA: Diagnosis not present

## 2023-11-09 DIAGNOSIS — H43823 Vitreomacular adhesion, bilateral: Secondary | ICD-10-CM | POA: Diagnosis not present

## 2023-11-09 DIAGNOSIS — H2513 Age-related nuclear cataract, bilateral: Secondary | ICD-10-CM | POA: Diagnosis not present

## 2023-11-16 DIAGNOSIS — L810 Postinflammatory hyperpigmentation: Secondary | ICD-10-CM | POA: Diagnosis not present

## 2023-11-16 DIAGNOSIS — L2089 Other atopic dermatitis: Secondary | ICD-10-CM | POA: Diagnosis not present

## 2023-12-07 DIAGNOSIS — L810 Postinflammatory hyperpigmentation: Secondary | ICD-10-CM | POA: Diagnosis not present

## 2023-12-07 DIAGNOSIS — L2089 Other atopic dermatitis: Secondary | ICD-10-CM | POA: Diagnosis not present

## 2024-02-08 DIAGNOSIS — E113393 Type 2 diabetes mellitus with moderate nonproliferative diabetic retinopathy without macular edema, bilateral: Secondary | ICD-10-CM | POA: Diagnosis not present

## 2024-02-08 DIAGNOSIS — H2513 Age-related nuclear cataract, bilateral: Secondary | ICD-10-CM | POA: Diagnosis not present

## 2024-02-08 DIAGNOSIS — H43823 Vitreomacular adhesion, bilateral: Secondary | ICD-10-CM | POA: Diagnosis not present

## 2024-02-08 DIAGNOSIS — H3582 Retinal ischemia: Secondary | ICD-10-CM | POA: Diagnosis not present

## 2024-02-09 DIAGNOSIS — L2089 Other atopic dermatitis: Secondary | ICD-10-CM | POA: Diagnosis not present

## 2024-02-09 DIAGNOSIS — L308 Other specified dermatitis: Secondary | ICD-10-CM | POA: Diagnosis not present

## 2024-04-12 DIAGNOSIS — E7841 Elevated Lipoprotein(a): Secondary | ICD-10-CM | POA: Diagnosis not present

## 2024-04-12 DIAGNOSIS — I10 Essential (primary) hypertension: Secondary | ICD-10-CM | POA: Diagnosis not present

## 2024-04-12 DIAGNOSIS — E782 Mixed hyperlipidemia: Secondary | ICD-10-CM | POA: Diagnosis not present

## 2024-04-12 DIAGNOSIS — Z125 Encounter for screening for malignant neoplasm of prostate: Secondary | ICD-10-CM | POA: Diagnosis not present

## 2024-04-12 DIAGNOSIS — E1169 Type 2 diabetes mellitus with other specified complication: Secondary | ICD-10-CM | POA: Diagnosis not present
